# Patient Record
Sex: Male | Born: 1960 | Race: Black or African American | Hispanic: No | Marital: Married | Smoking: Former smoker
Health system: Southern US, Community
[De-identification: ages and names within clinical notes are randomized; demographics above are authoritative.]

## PROBLEM LIST (undated history)

## (undated) DIAGNOSIS — I10 Essential (primary) hypertension: Secondary | ICD-10-CM

## (undated) DIAGNOSIS — Z9189 Other specified personal risk factors, not elsewhere classified: Secondary | ICD-10-CM

## (undated) DIAGNOSIS — Z973 Presence of spectacles and contact lenses: Secondary | ICD-10-CM

## (undated) DIAGNOSIS — L089 Local infection of the skin and subcutaneous tissue, unspecified: Secondary | ICD-10-CM

## (undated) HISTORY — PX: SHOULDER ARTHROSCOPY: SHX128

---

## 1994-02-26 HISTORY — PX: ELBOW SURGERY: SHX618

## 1999-06-15 ENCOUNTER — Encounter: Payer: Self-pay | Admitting: Specialist

## 1999-06-15 ENCOUNTER — Ambulatory Visit (HOSPITAL_COMMUNITY): Admission: RE | Admit: 1999-06-15 | Discharge: 1999-06-15 | Payer: Self-pay | Admitting: Specialist

## 2000-04-29 ENCOUNTER — Encounter: Payer: Self-pay | Admitting: Specialist

## 2000-04-29 ENCOUNTER — Ambulatory Visit (HOSPITAL_COMMUNITY): Admission: RE | Admit: 2000-04-29 | Discharge: 2000-04-29 | Payer: Self-pay | Admitting: Specialist

## 2000-06-26 ENCOUNTER — Encounter: Admission: RE | Admit: 2000-06-26 | Discharge: 2000-06-26 | Payer: Self-pay | Admitting: Occupational Medicine

## 2000-06-26 ENCOUNTER — Encounter: Payer: Self-pay | Admitting: Occupational Medicine

## 2002-05-01 ENCOUNTER — Encounter: Payer: Self-pay | Admitting: Specialist

## 2002-05-01 ENCOUNTER — Ambulatory Visit (HOSPITAL_COMMUNITY): Admission: RE | Admit: 2002-05-01 | Discharge: 2002-05-01 | Payer: Self-pay | Admitting: Emergency Medicine

## 2002-11-04 HISTORY — PX: CALCANEAL OSTEOTOMY WITH ILIAC CREST BONE GRAFT AND REPAIR SUBLEXING TENDON AND ACHILLES TENDON: SHX6325

## 2002-11-24 ENCOUNTER — Ambulatory Visit (HOSPITAL_COMMUNITY): Admission: RE | Admit: 2002-11-24 | Discharge: 2002-11-24 | Payer: Self-pay | Admitting: Orthopedic Surgery

## 2002-11-24 ENCOUNTER — Ambulatory Visit (HOSPITAL_BASED_OUTPATIENT_CLINIC_OR_DEPARTMENT_OTHER): Admission: RE | Admit: 2002-11-24 | Discharge: 2002-11-24 | Payer: Self-pay | Admitting: Orthopedic Surgery

## 2003-09-23 ENCOUNTER — Ambulatory Visit (HOSPITAL_COMMUNITY): Admission: RE | Admit: 2003-09-23 | Discharge: 2003-09-23 | Payer: Self-pay | Admitting: Orthopaedic Surgery

## 2005-04-09 ENCOUNTER — Ambulatory Visit: Payer: Self-pay | Admitting: Internal Medicine

## 2005-04-12 ENCOUNTER — Ambulatory Visit: Payer: Self-pay | Admitting: Internal Medicine

## 2005-04-18 ENCOUNTER — Ambulatory Visit: Payer: Self-pay | Admitting: Internal Medicine

## 2005-05-02 ENCOUNTER — Ambulatory Visit: Payer: Self-pay | Admitting: Internal Medicine

## 2005-07-10 ENCOUNTER — Ambulatory Visit: Payer: Self-pay | Admitting: Internal Medicine

## 2005-09-05 ENCOUNTER — Ambulatory Visit: Payer: Self-pay | Admitting: Internal Medicine

## 2007-02-05 ENCOUNTER — Ambulatory Visit: Payer: Self-pay | Admitting: Internal Medicine

## 2007-02-10 ENCOUNTER — Ambulatory Visit: Payer: Self-pay | Admitting: Internal Medicine

## 2007-02-18 DIAGNOSIS — I10 Essential (primary) hypertension: Secondary | ICD-10-CM | POA: Insufficient documentation

## 2007-02-18 DIAGNOSIS — K279 Peptic ulcer, site unspecified, unspecified as acute or chronic, without hemorrhage or perforation: Secondary | ICD-10-CM | POA: Insufficient documentation

## 2007-08-07 ENCOUNTER — Emergency Department (HOSPITAL_COMMUNITY): Admission: EM | Admit: 2007-08-07 | Discharge: 2007-08-07 | Payer: Self-pay | Admitting: Family Medicine

## 2007-08-08 ENCOUNTER — Telehealth (INDEPENDENT_AMBULATORY_CARE_PROVIDER_SITE_OTHER): Payer: Self-pay | Admitting: *Deleted

## 2007-08-08 ENCOUNTER — Encounter (INDEPENDENT_AMBULATORY_CARE_PROVIDER_SITE_OTHER): Payer: Self-pay | Admitting: *Deleted

## 2007-08-08 DIAGNOSIS — K921 Melena: Secondary | ICD-10-CM | POA: Insufficient documentation

## 2008-03-06 ENCOUNTER — Ambulatory Visit (HOSPITAL_COMMUNITY): Admission: RE | Admit: 2008-03-06 | Discharge: 2008-03-06 | Payer: Self-pay | Admitting: Otolaryngology

## 2008-03-22 ENCOUNTER — Ambulatory Visit: Payer: Self-pay | Admitting: Internal Medicine

## 2008-03-22 LAB — CONVERTED CEMR LAB
ALT: 20 units/L (ref 0–53)
AST: 23 units/L (ref 0–37)
Albumin: 3.8 g/dL (ref 3.5–5.2)
Alkaline Phosphatase: 66 units/L (ref 39–117)
BUN: 14 mg/dL (ref 6–23)
Basophils Absolute: 0.1 10*3/uL (ref 0.0–0.1)
Basophils Relative: 1.2 % (ref 0.0–3.0)
Bilirubin Urine: NEGATIVE
Bilirubin, Direct: 0.1 mg/dL (ref 0.0–0.3)
CO2: 27 meq/L (ref 19–32)
Calcium: 8.7 mg/dL (ref 8.4–10.5)
Chloride: 111 meq/L (ref 96–112)
Cholesterol: 120 mg/dL (ref 0–200)
Creatinine, Ser: 0.9 mg/dL (ref 0.4–1.5)
Eosinophils Absolute: 0.1 10*3/uL (ref 0.0–0.7)
Eosinophils Relative: 2 % (ref 0.0–5.0)
GFR calc Af Amer: 116 mL/min
GFR calc non Af Amer: 96 mL/min
Glucose, Bld: 99 mg/dL (ref 70–99)
HCT: 38 % — ABNORMAL LOW (ref 39.0–52.0)
HDL: 59.7 mg/dL (ref >39.0)
Hemoglobin, Urine: NEGATIVE
Hemoglobin: 13 g/dL (ref 13.0–17.0)
Ketones, ur: NEGATIVE mg/dL
LDL Cholesterol: 51 mg/dL (ref 0–99)
Leukocytes, UA: NEGATIVE
Lymphocytes Relative: 36.8 % (ref 12.0–46.0)
MCHC: 34.2 g/dL (ref 30.0–36.0)
MCV: 86.3 fL (ref 78.0–100.0)
Monocytes Absolute: 0.4 10*3/uL (ref 0.1–1.0)
Monocytes Relative: 7.5 % (ref 3.0–12.0)
Neutro Abs: 2.6 10*3/uL (ref 1.4–7.7)
Neutrophils Relative %: 52.5 % (ref 43.0–77.0)
Nitrite: NEGATIVE
PSA: 0.7 ng/mL (ref 0.10–4.00)
Platelets: 182 10*3/uL (ref 150–400)
Potassium: 4.1 meq/L (ref 3.5–5.1)
RBC: 4.41 M/uL (ref 4.22–5.81)
RDW: 13.7 % (ref 11.5–14.6)
Sodium: 144 meq/L (ref 135–145)
Specific Gravity, Urine: >=1.03 (ref 1.000–1.03)
TSH: 1.48 microintl units/mL (ref 0.35–5.50)
Total Bilirubin: 0.7 mg/dL (ref 0.3–1.2)
Total CHOL/HDL Ratio: 2
Total Protein, Urine: NEGATIVE mg/dL
Total Protein: 6.8 g/dL (ref 6.0–8.3)
Triglycerides: 46 mg/dL (ref 0–149)
Urine Glucose: NEGATIVE mg/dL
Urobilinogen, UA: 0.2 (ref 0.0–1.0)
VLDL: 9 mg/dL (ref 0–40)
WBC: 5.1 10*3/uL (ref 4.5–10.5)
pH: 5.5 (ref 5.0–8.0)

## 2008-03-26 ENCOUNTER — Ambulatory Visit: Payer: Self-pay | Admitting: Internal Medicine

## 2008-04-23 ENCOUNTER — Telehealth (INDEPENDENT_AMBULATORY_CARE_PROVIDER_SITE_OTHER): Payer: Self-pay | Admitting: *Deleted

## 2009-08-25 ENCOUNTER — Encounter: Payer: Self-pay | Admitting: Internal Medicine

## 2009-09-23 ENCOUNTER — Encounter: Payer: Self-pay | Admitting: Internal Medicine

## 2009-11-29 ENCOUNTER — Encounter: Payer: Self-pay | Admitting: Internal Medicine

## 2010-03-28 NOTE — Letter (Signed)
Summary: [facility]  [facility]   Imported By: Lester [place] 09/08/2009 07:29:07  _____________________________________________________________________  External Attachment:    Type:   Image     Comment:   External Document

## 2010-03-28 NOTE — Letter (Signed)
Summary: [facility]  [facility]   Imported By: Sherian Rein 12/05/2009 11:17:02  _____________________________________________________________________  External Attachment:    Type:   Image     Comment:   External Document

## 2010-03-28 NOTE — Letter (Signed)
Summary: [facility]  [facility]   Imported By: Sherian Rein 09/26/2009 14:56:25  _____________________________________________________________________  External Attachment:    Type:   Image     Comment:   External Document

## 2010-07-14 NOTE — Op Note (Signed)
Peter Hunt, Peter Hunt                             ACCOUNT NO.:  [id number]   MEDICAL RECORD NO.:  [id number]                   PATIENT TYPE:  AMB   LOCATION:  DSC                                  FACILITY:  [facility]   PHYSICIAN:  Leonides Grills, M.D.                  DATE OF BIRTH:  1960/07/21   DATE OF PROCEDURE:  11/24/2002  DATE OF DISCHARGE:                                 OPERATIVE REPORT   PREOPERATIVE DIAGNOSES:  1. 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  2. Right hindfoot malposition/right tight Achilles' tendon.   POSTOPERATIVE DIAGNOSES:  1. 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  2. Right hindfoot malposition/right tight Achilles' tendon.   OPERATION:  1. Right lengthening calcaneal osteotomy.  2. Right iliac crest bone graft.  3. Right medializing calcaneal osteotomy.  4. Right percutaneous tendo-Achilles lengthening.  5. Right flexor-digitorum-longus-to-navicular transfer.  6. Right tenosynovectomy, posterior tibial tendon.   SURGEON:  Leonides Grills, M.D.   ASSISTANT:  Lianne Cure, P.A.   ANESTHESIA:  General endotracheal tube.   ESTIMATED BLOOD LOSS:  Minimal.   TOURNIQUET TIME:  Approximately an hour and a half.   COMPLICATIONS:  None.   DISPOSITION:  Stable to PR.   INDICATION:  This is a 50 year old male who has had prolonged posterior  ankle pain as well as arch pain, despite conservative management.  He was  consented for the above procedure.  All risks which include infection,  neurovascular injury, nonunion, malunion, hardware rotation, hardware  failure, persistent pain, worsening pain, stiffness, arthritis, recurrence  of deformity and pain were all explained, questions were encouraged and  answered.   OPERATION:  The patient was brought to the operating room and placed in  supine position after adequate general endotracheal tube anesthesia was  administered as well as Ancef 1 g IV piggyback.  The  right lower extremity  was then prepped and draped in a sterile manner over a proximally placed  thigh tourniquet as well as the right iliac crest bone graft site.  The  procedure commenced with a longitudinal incision over the iliac crest bone  graft site.  Dissection was carried down to the crest; hemostasis was  obtained.  Soft tissues were elevated over the inner and outer table of the  right iliac crest.  Then with a sagittal saw, an approximately 8-mm bone  graft trapezoidal-shaped tricortical was then harvested with the aid of a  sagittal saw and a curved quarter-inch osteotome.  Once this was removed,  bone wax was placed to exposed bone areas.  Deep tissues were closed with 0  Vicryl, subcu was closed with 2-0 Vicryl and skin was closed with 4-0  Monocryl subcuticular stitches and Steri-Strips were applied.  We placed the  bone graft on the back table for later lengthening calcaneal osteotomy.  We  then performed two medial and one lateral hemisections of  the Achilles'  tendon; this had excellent release of the tight Achilles' tendon.  We then  gravity-exsanguinated the right lower extremity and tourniquet was elevated  to 290 mmHg.  A longitudinal incision over the posterior tibial tendon was  then made.  Hemostasis was obtained.  Flexor retinaculum was opened wide at  the incision; posterior tibial tendon was then identified.  There was a  large amount of tenosynovitis in this area; this was meticulously debrided.  There were no rents in the tendon and there was excursion with the tendon,  so we decided to leave the tendon and not excise it.  We then identified the  FDL tendon and traced this to knot of Henry and tenotomized this and left  this in the wound for later transfer to the navicular.  We then went on the  lateral aspect of the foot.  A longitudinal incision was then made over the  calcaneal neck.  Dissection was carried down through skin and hemostasis was  obtained.   Soft tissue was then elevated over the superior and inferior  aspect of the calcaneal neck.  CC joint was then identified and  approximately 1.2 cm proximal to this, an osteotomy was then made  perpendicular to the lateral wall of the calcaneus, parallel to the CC  joint.  Once this was made, laminar spreader was then placed and the  lengthening calcaneal osteotomy was then done.  The tricortical iliac crest  bone graft was then tamped into place.  This was then affixed with a 3.5-mm  fully threaded cortical screw using a 2.5-mm drill hole, respectively; this  had excellent maintenance of the lengthening.  Peroneal tendons were intact  as well.  Wound was copiously irrigated with normal saline.  We then made a  longitudinal incision over the lateral wall of the calcaneus, over the  calcaneal tuber.  Dissection was carried down to bone.  Soft tissues were  elevated both superiorly and inferiorly, then utilizing a 4000 saw, an  osteotomy was then made with Homan retractors placed both superiorly and  inferiorly.  Once this was done, a large osteotome followed by a large  laminar spreader were then placed to stretch the soft tissues and the heel  was then translated medially approximately 8 to 10 mm.  This was then  provisionally affixed with a 2-mm K-wire.  A longitudinal incision was then  made in the midline of the heel.  Dissection was carried down to bone.  Two  55-mm 16-mm threaded cancellous screws were then placed using 4.5- and 3.2-  mm drill holes, respectively.  K-wires were then removed.  X-rays were then  obtained in the AP axial and lateral views of the hindfoot and showed  excellent placement of the fixation as well as osteotomies with correction  of the hindfoot.  The wounds were copiously irrigated with normal saline.  We then went back onto the medial aspect of the foot.  The 3.5-mm and 4.5-mm drill holes were then made in the medial aspect of the navicular and through  the  middle of the posterior tibial tendon plantarly.  The FDL tendon was  then pulled through the posterior tibial tendon and in the drill hole using  a #2 FiberWire.  This was then sewn and transferred to the navicular as well  as the posterior tibial tendon using #2 FiberWire and 2-0 FiberWire.  Wound  was copiously irrigated with normal saline, tourniquet was deflated,  hemostasis was obtained.  Flexor retinaculum  was closed with 2-0 Vicryl  sutures, subcu was closed with 3-0 Vicryl sutures and skin was closed with 4-  0 nylon sutures over all wounds.  Sterile dressing was applied.  Modified  Jones dressing was applied with the ankle in neutral dorsiflexion.  The  patient was stable to the PR.                                               Leonides Grills, M.D.    PB/MEDQ  D:  11/24/2002  T:  11/24/2002  Job:  [id number]

## 2010-11-23 LAB — POCT I-STAT, CHEM 8
BUN: 15
Calcium, Ion: 1.24
Chloride: 104
Creatinine, Ser: 1.2
Sodium: 143
TCO2: 30

## 2014-02-03 ENCOUNTER — Emergency Department (HOSPITAL_COMMUNITY): Payer: Worker's Compensation

## 2014-02-03 ENCOUNTER — Emergency Department (HOSPITAL_COMMUNITY): Payer: Worker's Compensation | Admitting: Anesthesiology

## 2014-02-03 ENCOUNTER — Encounter (HOSPITAL_COMMUNITY)
Admission: EM | Disposition: A | Discharge status: Home / Self Care | Payer: Self-pay | Source: Home / Self Care | Attending: Emergency Medicine

## 2014-02-03 ENCOUNTER — Ambulatory Visit (HOSPITAL_COMMUNITY)
Admission: EM | Admit: 2014-02-03 | Discharge: 2014-02-03 | Disposition: A | Discharge status: Home / Self Care | Payer: Worker's Compensation | Attending: Emergency Medicine | Admitting: Emergency Medicine

## 2014-02-03 ENCOUNTER — Encounter (HOSPITAL_COMMUNITY): Payer: Self-pay | Admitting: Emergency Medicine

## 2014-02-03 DIAGNOSIS — S63251A Unspecified dislocation of left index finger, initial encounter: Secondary | ICD-10-CM | POA: Insufficient documentation

## 2014-02-03 DIAGNOSIS — Y929 Unspecified place or not applicable: Secondary | ICD-10-CM | POA: Diagnosis not present

## 2014-02-03 DIAGNOSIS — IMO0002 Reserved for concepts with insufficient information to code with codable children: Secondary | ICD-10-CM

## 2014-02-03 DIAGNOSIS — W208XXA Other cause of strike by thrown, projected or falling object, initial encounter: Secondary | ICD-10-CM | POA: Insufficient documentation

## 2014-02-03 DIAGNOSIS — IMO0001 Reserved for inherently not codable concepts without codable children: Secondary | ICD-10-CM

## 2014-02-03 DIAGNOSIS — Z008 Encounter for other general examination: Secondary | ICD-10-CM

## 2014-02-03 DIAGNOSIS — Z01818 Encounter for other preprocedural examination: Secondary | ICD-10-CM

## 2014-02-03 DIAGNOSIS — I10 Essential (primary) hypertension: Secondary | ICD-10-CM | POA: Insufficient documentation

## 2014-02-03 DIAGNOSIS — T148 Other injury of unspecified body region: Secondary | ICD-10-CM | POA: Diagnosis present

## 2014-02-03 HISTORY — PX: I & D EXTREMITY: SHX5045

## 2014-02-03 HISTORY — PX: I&D EXTREMITY: SHX5045

## 2014-02-03 HISTORY — PX: PERCUTANEOUS PINNING: SHX2209

## 2014-02-03 HISTORY — DX: Essential (primary) hypertension: I10

## 2014-02-03 LAB — CBC
HCT: 41 % (ref 39.0–52.0)
HEMOGLOBIN: 13.9 g/dL (ref 13.0–17.0)
MCH: 28.5 pg (ref 26.0–34.0)
MCHC: 33.9 g/dL (ref 30.0–36.0)
MCV: 84 fL (ref 78.0–100.0)
Platelets: 199 10*3/uL (ref 150–400)
RBC: 4.88 MIL/uL (ref 4.22–5.81)
RDW: 13.9 % (ref 11.5–15.5)
WBC: 6.7 10*3/uL (ref 4.0–10.5)

## 2014-02-03 LAB — BASIC METABOLIC PANEL
Anion gap: 15 (ref 5–15)
BUN: 13 mg/dL (ref 6–23)
CHLORIDE: 104 meq/L (ref 96–112)
CO2: 23 mEq/L (ref 19–32)
Calcium: 9.5 mg/dL (ref 8.4–10.5)
Creatinine, Ser: 0.84 mg/dL (ref 0.50–1.35)
GLUCOSE: 96 mg/dL (ref 70–99)
POTASSIUM: 4.3 meq/L (ref 3.7–5.3)
SODIUM: 142 meq/L (ref 137–147)

## 2014-02-03 SURGERY — IRRIGATION AND DEBRIDEMENT EXTREMITY
Anesthesia: General | Site: Finger | Laterality: Left

## 2014-02-03 MED ORDER — MEPERIDINE HCL 25 MG/ML IJ SOLN: 6.2500 mg | INTRAMUSCULAR | Status: DC | PRN

## 2014-02-03 MED ORDER — ONDANSETRON HCL 4 MG/2ML IJ SOLN
INTRAMUSCULAR | Status: DC | PRN
Start: 2014-02-03 — End: 2014-02-03
  Administered 2014-02-03: 4 mg via INTRAVENOUS

## 2014-02-03 MED ORDER — MIDAZOLAM HCL 2 MG/2ML IJ SOLN: 0.5000 mg | Freq: Once | INTRAMUSCULAR | Status: DC | PRN

## 2014-02-03 MED ORDER — DOCUSATE SODIUM 100 MG PO CAPS: 100.0000 mg | ORAL_CAPSULE | Freq: Two times a day (BID) | ORAL | Status: DC

## 2014-02-03 MED ORDER — MIDAZOLAM HCL 5 MG/5ML IJ SOLN
INTRAMUSCULAR | Status: DC | PRN
  Administered 2014-02-03: 2 mg via INTRAVENOUS

## 2014-02-03 MED ORDER — FENTANYL CITRATE 0.05 MG/ML IJ SOLN
INTRAMUSCULAR | Status: DC | PRN
  Administered 2014-02-03 (×2): 50 ug via INTRAVENOUS

## 2014-02-03 MED ORDER — OXYCODONE HCL 5 MG PO TABS: 5.0000 mg | ORAL_TABLET | Freq: Once | ORAL | Status: DC | PRN

## 2014-02-03 MED ORDER — FENTANYL CITRATE 0.05 MG/ML IJ SOLN
INTRAMUSCULAR | Status: AC
  Filled 2014-02-03: qty 5

## 2014-02-03 MED ORDER — HYDROMORPHONE HCL 1 MG/ML IJ SOLN
INTRAMUSCULAR | Status: AC
  Filled 2014-02-03: qty 1

## 2014-02-03 MED ORDER — LACTATED RINGERS IV SOLN
INTRAVENOUS | Status: DC | PRN
  Administered 2014-02-03: 21:00:00 via INTRAVENOUS

## 2014-02-03 MED ORDER — PROPOFOL 10 MG/ML IV BOLUS
INTRAVENOUS | Status: DC | PRN
  Administered 2014-02-03: 200 mg via INTRAVENOUS
  Administered 2014-02-03: 100 mg via INTRAVENOUS

## 2014-02-03 MED ORDER — PROMETHAZINE HCL 25 MG/ML IJ SOLN: 6.2500 mg | INTRAMUSCULAR | Status: DC | PRN

## 2014-02-03 MED ORDER — CEPHALEXIN 500 MG PO CAPS: 500.0000 mg | ORAL_CAPSULE | Freq: Four times a day (QID) | ORAL | Status: DC

## 2014-02-03 MED ORDER — MIDAZOLAM HCL 2 MG/2ML IJ SOLN
INTRAMUSCULAR | Status: AC
  Filled 2014-02-03: qty 2

## 2014-02-03 MED ORDER — TETANUS-DIPHTH-ACELL PERTUSSIS 5-2.5-18.5 LF-MCG/0.5 IM SUSP
0.5000 mL | Freq: Once | INTRAMUSCULAR | Status: AC
  Administered 2014-02-03: 0.5 mL via INTRAMUSCULAR
  Filled 2014-02-03: qty 0.5

## 2014-02-03 MED ORDER — BUPIVACAINE HCL (PF) 0.25 % IJ SOLN
INTRAMUSCULAR | Status: AC
  Filled 2014-02-03: qty 30

## 2014-02-03 MED ORDER — BUPIVACAINE HCL (PF) 0.25 % IJ SOLN
INTRAMUSCULAR | Status: DC | PRN
  Administered 2014-02-03: 6 mL

## 2014-02-03 MED ORDER — CEFAZOLIN SODIUM-DEXTROSE 2-3 GM-% IV SOLR
INTRAVENOUS | Status: DC | PRN
  Administered 2014-02-03: 2 g via INTRAVENOUS

## 2014-02-03 MED ORDER — OXYCODONE HCL 5 MG/5ML PO SOLN: 5.0000 mg | Freq: Once | ORAL | Status: DC | PRN

## 2014-02-03 MED ORDER — HYDROMORPHONE HCL 1 MG/ML IJ SOLN
0.2500 mg | INTRAMUSCULAR | Status: DC | PRN
  Administered 2014-02-03: 0.25 mg via INTRAVENOUS

## 2014-02-03 MED ORDER — HYDROCODONE-ACETAMINOPHEN 5-300 MG PO TABS
1.0000 | ORAL_TABLET | Freq: Four times a day (QID) | ORAL | Status: DC | PRN
Start: 2014-02-03 — End: 2014-03-04

## 2014-02-03 MED ORDER — OXYCODONE-ACETAMINOPHEN 5-325 MG PO TABS
2.0000 | ORAL_TABLET | Freq: Once | ORAL | Status: AC
  Administered 2014-02-03: 2 via ORAL
  Filled 2014-02-03: qty 2

## 2014-02-03 SURGICAL SUPPLY — 66 items
ANCHOR FT CORKSCREW MICRO 2-0 (Anchor) ×4 IMPLANT
AR MINI SUTURE KIT ×2 IMPLANT
BANDAGE ELASTIC 3 VELCRO ST LF (GAUZE/BANDAGES/DRESSINGS) ×2 IMPLANT
BANDAGE ELASTIC 4 VELCRO ST LF (GAUZE/BANDAGES/DRESSINGS) ×2 IMPLANT
BENZOIN TINCTURE PRP APPL 2/3 (GAUZE/BANDAGES/DRESSINGS) IMPLANT
BLADE SURG ROTATE 9660 (MISCELLANEOUS) IMPLANT
BNDG COHESIVE 1X5 TAN STRL LF (GAUZE/BANDAGES/DRESSINGS) ×2 IMPLANT
BNDG CONFORM 2 STRL LF (GAUZE/BANDAGES/DRESSINGS) IMPLANT
BNDG ESMARK 4X9 LF (GAUZE/BANDAGES/DRESSINGS) ×2 IMPLANT
BNDG GAUZE ELAST 4 BULKY (GAUZE/BANDAGES/DRESSINGS) ×2 IMPLANT
CORDS BIPOLAR (ELECTRODE) ×2 IMPLANT
COVER SURGICAL LIGHT HANDLE (MISCELLANEOUS) ×2 IMPLANT
CUFF TOURNIQUET SINGLE 18IN (TOURNIQUET CUFF) ×2 IMPLANT
CUFF TOURNIQUET SINGLE 24IN (TOURNIQUET CUFF) IMPLANT
DRAIN PENROSE 1/4X12 LTX STRL (WOUND CARE) IMPLANT
DRAPE OEC MINIVIEW 54X84 (DRAPES) ×2 IMPLANT
DRAPE SURG 17X23 STRL (DRAPES) ×2 IMPLANT
DRSG ADAPTIC 3X8 NADH LF (GAUZE/BANDAGES/DRESSINGS) ×2 IMPLANT
DRSG EMULSION OIL 3X3 NADH (GAUZE/BANDAGES/DRESSINGS) IMPLANT
ELECT REM PT RETURN 9FT ADLT (ELECTROSURGICAL)
ELECTRODE REM PT RTRN 9FT ADLT (ELECTROSURGICAL) IMPLANT
GAUZE SPONGE 4X4 12PLY STRL (GAUZE/BANDAGES/DRESSINGS) ×2 IMPLANT
GAUZE XEROFORM 1X8 LF (GAUZE/BANDAGES/DRESSINGS) ×2 IMPLANT
GAUZE XEROFORM 5X9 LF (GAUZE/BANDAGES/DRESSINGS) IMPLANT
GLOVE BIOGEL PI IND STRL 8.5 (GLOVE) ×1 IMPLANT
GLOVE BIOGEL PI INDICATOR 8.5 (GLOVE) ×1
GLOVE SURG ORTHO 8.0 STRL STRW (GLOVE) ×2 IMPLANT
GOWN STRL REUS W/ TWL LRG LVL3 (GOWN DISPOSABLE) ×3 IMPLANT
GOWN STRL REUS W/ TWL XL LVL3 (GOWN DISPOSABLE) ×1 IMPLANT
GOWN STRL REUS W/TWL LRG LVL3 (GOWN DISPOSABLE) ×3
GOWN STRL REUS W/TWL XL LVL3 (GOWN DISPOSABLE) ×1
HANDPIECE INTERPULSE COAX TIP (DISPOSABLE)
K-WIRE DBL TROCAR .045X4 ×2 IMPLANT
KIT BASIN OR (CUSTOM PROCEDURE TRAY) ×2 IMPLANT
KIT ROOM TURNOVER OR (KITS) ×2 IMPLANT
KWIRE DBL TROCAR .045X4 ×1 IMPLANT
MANIFOLD NEPTUNE II (INSTRUMENTS) ×2 IMPLANT
NEEDLE HYPO 25GX1X1/2 BEV (NEEDLE) ×2 IMPLANT
NS IRRIG 1000ML POUR BTL (IV SOLUTION) ×2 IMPLANT
PACK ORTHO EXTREMITY (CUSTOM PROCEDURE TRAY) ×2 IMPLANT
PAD ARMBOARD 7.5X6 YLW CONV (MISCELLANEOUS) ×4 IMPLANT
PAD CAST 4YDX4 CTTN HI CHSV (CAST SUPPLIES) ×1 IMPLANT
PADDING CAST COTTON 4X4 STRL (CAST SUPPLIES) ×1
SET HNDPC FAN SPRY TIP SCT (DISPOSABLE) IMPLANT
SOAP 2 % CHG 4 OZ (WOUND CARE) IMPLANT
SPLINT FINGER (SOFTGOODS) ×2 IMPLANT
SPONGE GAUZE 4X4 12PLY STER LF (GAUZE/BANDAGES/DRESSINGS) ×2 IMPLANT
SPONGE LAP 18X18 X RAY DECT (DISPOSABLE) ×2 IMPLANT
SPONGE LAP 4X18 X RAY DECT (DISPOSABLE) ×2 IMPLANT
STRIP CLOSURE SKIN 1/2X4 (GAUZE/BANDAGES/DRESSINGS) IMPLANT
SUCTION FRAZIER TIP 10 FR DISP (SUCTIONS) ×2 IMPLANT
SUT CHROMIC 5 0 P 3 (SUTURE) ×2 IMPLANT
SUT ETHILON 4 0 P 3 18 (SUTURE) IMPLANT
SUT ETHILON 4 0 PS 2 18 (SUTURE) IMPLANT
SUT ETHILON 5 0 P 3 18 (SUTURE) ×1
SUT NYLON ETHILON 5-0 P-3 1X18 (SUTURE) ×1 IMPLANT
SUT PROLENE 4 0 P 3 18 (SUTURE) IMPLANT
SUT VICRYL RAPIDE 4/0 PS 2 (SUTURE) ×2 IMPLANT
SYR CONTROL 10ML LL (SYRINGE) ×2 IMPLANT
TOWEL OR 17X24 6PK STRL BLUE (TOWEL DISPOSABLE) ×2 IMPLANT
TOWEL OR 17X26 10 PK STRL BLUE (TOWEL DISPOSABLE) ×2 IMPLANT
TUBE ANAEROBIC SPECIMEN COL (MISCELLANEOUS) IMPLANT
TUBE CONNECTING 12X1/4 (SUCTIONS) ×2 IMPLANT
UNDERPAD 30X30 INCONTINENT (UNDERPADS AND DIAPERS) ×2 IMPLANT
WATER STERILE IRR 1000ML POUR (IV SOLUTION) ×2 IMPLANT
YANKAUER SUCT BULB TIP NO VENT (SUCTIONS) ×2 IMPLANT

## 2014-02-03 NOTE — Anesthesia Postprocedure Evaluation (Signed)
  Anesthesia Post-op Note  Patient: Herb Grays  Procedure(s) Performed: Procedure(s): IRRIGATION AND DEBRIDEMENT LEFT INDEX FINGER,  REPAIR TENDON LEFT INDEX FINGER (Left) PERCUTANEOUS PINNING LEFT INDEX FINGER (Left)  Patient Location: PACU  Anesthesia Type:General  Level of Consciousness: awake, alert , oriented and patient cooperative  Airway and Oxygen Therapy: Patient Spontanous Breathing  Post-op Pain: none  Post-op Assessment: Post-op Vital signs reviewed, Patient's Cardiovascular Status Stable, Respiratory Function Stable, Patent Airway, No signs of Nausea or vomiting, Adequate PO intake and Pain level controlled  Post-op Vital Signs: Reviewed and stable  Last Vitals:  Filed Vitals:   02/03/14 2300  BP: 157/98  Pulse: 93  Temp:   Resp: 15    Complications: No apparent anesthesia complications

## 2014-02-03 NOTE — Discharge Instructions (Signed)
KEEP BANDAGE CLEAN AND DRY CALL OFFICE FOR F/U APPT [phone] IN 8 DAYS DR Elkader PHONE [phone] ALSO CALL FOR THERAPY APPT IN 8 DAYS [phone] EXT [phone] IN 8 DAYS KEEP HAND ELEVATED ABOVE HEART OK TO APPLY ICE TO OPERATIVE AREA CONTACT OFFICE IF ANY WORSENING PAIN OR CONCERNS.

## 2014-02-03 NOTE — Brief Op Note (Signed)
02/03/2014  8:26 PM  PATIENT:  Peter Hunt  53 y.o. male  PRE-OPERATIVE DIAGNOSIS:  Fractured Left Index Finger  POST-OPERATIVE DIAGNOSIS:  * No post-op diagnosis entered *  PROCEDURE:  Procedure(s): IRRIGATION AND DEBRIDEMENT EXTREMITY (Left) PERCUTANEOUS PINNING EXTREMITY (Left)  SURGEON:  Surgeon(s) and Role:    * Linna Hoff, MD - Primary  PHYSICIAN ASSISTANT:   ASSISTANTS: none   ANESTHESIA:   general  EBL:     BLOOD ADMINISTERED:none  DRAINS: none   LOCAL MEDICATIONS USED:  NONE  SPECIMEN:  No Specimen  DISPOSITION OF SPECIMEN:  N/A  COUNTS:  YES  TOURNIQUET:    DICTATION: .[id number]  PLAN OF CARE: Discharge to home after PACU  PATIENT DISPOSITION:  PACU - hemodynamically stable.   Delay start of Pharmacological VTE agent (>24hrs) due to surgical blood loss or risk of bleeding: not applicable

## 2014-02-03 NOTE — Transfer of Care (Signed)
Immediate Anesthesia Transfer of Care Note  Patient: Peter Hunt  Procedure(s) Performed: Procedure(s): IRRIGATION AND DEBRIDEMENT LEFT INDEX FINGER,  REPAIR TENDON LEFT INDEX FINGER (Left) PERCUTANEOUS PINNING LEFT INDEX FINGER (Left)  Patient Location: PACU  Anesthesia Type:General  Level of Consciousness: awake, alert  and oriented  Airway & Oxygen Therapy: Patient Spontanous Breathing and Patient connected to nasal cannula oxygen  Post-op Assessment: Report given to PACU RN and Post -op Vital signs reviewed and stable  Post vital signs: Reviewed and stable  Complications: No apparent anesthesia complications

## 2014-02-03 NOTE — ED Notes (Signed)
LAST FOOD/FLUID AT 1000 TODAY.

## 2014-02-03 NOTE — Anesthesia Preprocedure Evaluation (Addendum)
Anesthesia Evaluation    Airway        Dental   Pulmonary          Cardiovascular hypertension, Pt. on medications     Neuro/Psych    GI/Hepatic   Endo/Other    Renal/GU      Musculoskeletal   Abdominal   Peds  Hematology   Anesthesia Other Findings   Reproductive/Obstetrics                            Anesthesia Physical Anesthesia Plan  ASA: II  Anesthesia Plan:    Post-op Pain Management:    Induction:   Airway Management Planned:   Additional Equipment:   Intra-op Plan:   Post-operative Plan:   Informed Consent:   Plan Discussed with:   Anesthesia Plan Comments:         Anesthesia Quick Evaluation

## 2014-02-03 NOTE — ED Provider Notes (Signed)
CSN: [id number]     Arrival date & time 02/03/14  1323 History  This chart was scribed for non-physician practitioner, Montine Circle, PA-C working with Fredia Sorrow, MD by Frederich Balding, ED scribe. This patient was seen in room TR11C/TR11C and the patient's care was started at 1:41 PM.   Chief Complaint  Patient presents with  . Finger Injury   The history is provided by the patient. No language interpreter was used.    HPI Comments: Peter Hunt is a 53 y.o. male who presents to the Emergency Department complaining of left index finger injury that occurred earlier today around 10 AM. States he dropped a flat steel bar on his finger. Reports sudden onset pain and laceration. Pt went to [facility] after the accident happened and was told to come to the ED for evaluation. He has not been given anything for the pain yet. Pt states is tetanus is out of date.   Past Medical History  Diagnosis Date  . Hypertension    Past Surgical History  Procedure Laterality Date  . Shoulder surgery    . Elbow surgery    . Knee surgery     No family history on file. History  Substance Use Topics  . Smoking status: Never Smoker   . Smokeless tobacco: Not on file  . Alcohol Use: No    Review of Systems  Constitutional: Negative for fever.  HENT: Negative for congestion.   Eyes: Negative for redness.  Respiratory: Negative for shortness of breath.   Cardiovascular: Negative for chest pain.  Gastrointestinal: Negative for abdominal distention.  Musculoskeletal: Positive for arthralgias.  Skin: Positive for wound.  Neurological: Negative for speech difficulty.  Psychiatric/Behavioral: Negative for confusion.   Allergies  Ace inhibitors  Home Medications   Prior to Admission medications   Not on File   BP 157/89 mmHg  Pulse 93  Temp(Src) 98.8 F (37.1 C) (Oral)  Resp 18  Ht 5\' 8"  (1.727 m)  Wt 185 lb (83.915 kg)  BMI 28.14 kg/m2  SpO2 96%   Physical Exam  Constitutional: He is  oriented to person, place, and time. He appears well-developed. No distress.  HENT:  Head: Normocephalic and atraumatic.  Eyes: Conjunctivae and EOM are normal.  Cardiovascular: Normal rate, regular rhythm and intact distal pulses.   Brisk capillary refill.  Pulmonary/Chest: Effort normal. No stridor. No respiratory distress.  Abdominal: He exhibits no distension.  Musculoskeletal: He exhibits no edema.  Left index finger flexion 5/5 at the DIP and PIP. Extension 5/5 at the PIP and 0/5 at the DIP.   Neurological: He is alert and oriented to person, place, and time.  Sensation intact.  Skin: Skin is warm and dry.  2-3 cm semi-circular laceration on the left index finger as pictured. Apparent open fracture.  Psychiatric: He has a normal mood and affect.  Nursing note and vitals reviewed.       ED Course  Procedures (including critical care time)  DIAGNOSTIC STUDIES: Oxygen Saturation is 96% on RA, normal by my interpretation.    COORDINATION OF CARE: 1:46 PM-Discussed treatment plan which includes updating tetanus, finger xray and pain medication with pt at bedside and pt agreed to plan.  3:52 PM-Spoke with Dr. Caralyn Guile. He will take pt up to surgery to repair finger.    Labs Review Labs Reviewed - No data to display  Imaging Review Dg Finger Index Left  02/03/2014   CLINICAL DATA:  53 year old male with history of trauma to the distal  aspect of the left second finger when 8 200 lb steel bar rolled onto the left index finger this morning at work.  EXAM: LEFT INDEX FINGER 2+V  COMPARISON:  No priors.  FINDINGS: There is complete volar dislocation of the second distal phalanx at the DIP joint. The dorsal aspect of the base of the second distal phalanx appears to rest along the volar aspect of the distal second middle phalanx. No definite acute displaced fracture. Soft tissues are markedly swollen in the distal aspect of the left second finger.  IMPRESSION: 1. Complete volar  dislocation of the distal phalanx of the left second finger.   Electronically Signed   By: Vinnie Langton M.D.   On: 02/03/2014 15:10     EKG Interpretation None      MDM   Final diagnoses:  Encounter for medical assessment  Laceration  Dislocation    Patient with laceration dislocation of left index finger. Patient discussed with Dr. Rogene Houston, who recommends hand surgery consultation. Patient discussed with Dr. Caralyn Guile from hand surgery, who recommends patient be taken to surgery today. Patient is nothing by mouth, pain is currently under control. Dr. Caralyn Guile, to see the patient.  I personally performed the services described in this documentation, which was scribed in my presence. The recorded information has been reviewed and is accurate.  Montine Circle, PA-C 02/03/14 [place], MD 02/04/14 [phone]

## 2014-02-03 NOTE — ED Notes (Signed)
Dropped steel pipe onto left index finger while at work. Sent from [facility] "to see hand specialist".  Finger currently wrapped.

## 2014-02-03 NOTE — H&P (Signed)
Peter Hunt is an 53 y.o. male.   Chief Complaint: left index finger open injury HPI: Pt sustained left index finger injury at work Pt with no prior history to injury to left index finger Pt seen/evaluated and here for surgery No other injuries to left hand  Past Medical History  Diagnosis Date  . Hypertension     Past Surgical History  Procedure Laterality Date  . Shoulder surgery    . Elbow surgery    . Knee surgery      No family history on file. Social History:  reports that he has never smoked. He does not have any smokeless tobacco history on file. He reports that he does not drink alcohol or use illicit drugs.  Allergies:  Allergies  Allergen Reactions  . Ace Inhibitors     REACTION: cough     (Not in a hospital admission)  Results for orders placed or performed during the hospital encounter of 02/03/14 (from the past 48 hour(s))  CBC     Status: None   Collection Time: 02/03/14  4:05 PM  Result Value Ref Range   WBC 6.7 4.0 - 10.5 K/uL   RBC 4.88 4.22 - 5.81 MIL/uL   Hemoglobin 13.9 13.0 - 17.0 g/dL   HCT 41.0 39.0 - 52.0 %   MCV 84.0 78.0 - 100.0 fL   MCH 28.5 26.0 - 34.0 pg   MCHC 33.9 30.0 - 36.0 g/dL   RDW 13.9 11.5 - 15.5 %   Platelets 199 150 - 400 K/uL  Basic metabolic panel     Status: None   Collection Time: 02/03/14  4:05 PM  Result Value Ref Range   Sodium 142 137 - 147 mEq/L   Potassium 4.3 3.7 - 5.3 mEq/L   Chloride 104 96 - 112 mEq/L   CO2 23 19 - 32 mEq/L   Glucose, Bld 96 70 - 99 mg/dL   BUN 13 6 - 23 mg/dL   Creatinine, Ser 0.84 0.50 - 1.35 mg/dL   Calcium 9.5 8.4 - 10.5 mg/dL   GFR calc non Af Amer >90 >90 mL/min   GFR calc Af Amer >90 >90 mL/min    Comment: (NOTE) The eGFR has been calculated using the CKD EPI equation. This calculation has not been validated in all clinical situations. eGFR's persistently <90 mL/min signify possible Chronic Kidney Disease.    Anion gap 15 5 - 15   Dg Chest 2 View  02/03/2014   CLINICAL  DATA:  Pre-op for left index finger irrigation and debridement. History of hypertension. Initial encounter.  EXAM: CHEST  2 VIEW  COMPARISON:  None.  FINDINGS: The heart size and mediastinal contours are normal. The lungs are clear. There is mild blunting of the left costophrenic angle on the frontal examination, but no significant pleural effusion is present on the lateral view. This likely represents mild scarring. There is evidence of previous distal right clavicle resection. No acute osseous findings are demonstrated.  IMPRESSION: No acute cardiopulmonary process.   Electronically Signed   By: Camie Patience M.D.   On: 02/03/2014 18:48   Dg Finger Index Left  02/03/2014   CLINICAL DATA:  53 year old male with history of trauma to the distal aspect of the left second finger when 8 200 lb steel bar rolled onto the left index finger this morning at work.  EXAM: LEFT INDEX FINGER 2+V  COMPARISON:  No priors.  FINDINGS: There is complete volar dislocation of the second distal phalanx at the  DIP joint. The dorsal aspect of the base of the second distal phalanx appears to rest along the volar aspect of the distal second middle phalanx. No definite acute displaced fracture. Soft tissues are markedly swollen in the distal aspect of the left second finger.  IMPRESSION: 1. Complete volar dislocation of the distal phalanx of the left second finger.   Electronically Signed   By: Vinnie Langton M.D.   On: 02/03/2014 15:10    ROS: NO RECENT ILLNESSES OR HOSPITALIZATIONS  Blood pressure 144/94, pulse 78, temperature 97.9 F (36.6 C), temperature source Oral, resp. rate 16, height _0  (1.727 m), weight 83.915 kg (185 lb), SpO2 96 %. Physical Exam  General Appearance:  Alert, cooperative, no distress, appears stated age  Head:  Normocephalic, without obvious abnormality, atraumatic  Eyes:  Pupils equal, conjunctiva/corneas clear,         Throat: Lips, mucosa, and tongue normal; teeth and gums normal  Neck: No  visible masses     Lungs:   respirations unlabored  Chest Wall:  No tenderness or deformity  Heart:  Regular rate and rhythm,  Abdomen:   Soft, non-tender,         Extremities: LEFT INDEX FINGER: IN BANDAGE, FINGER TIP WARM WELL PERFUSED PICTURES REVIEWED SHOW INJURY NO INJURY TO LONG/RING/SMALL FINGERS GOOD THUMB MOBILITY  Pulses: 2+ and symmetric  Skin: Skin color, texture, turgor normal, no rashes or lesions     Neurologic: Normal     Assessment/Plan LEFT INDEX FINGER OPEN DIP DISLOCATION AND SOFT TISSUE INJURY, EXTENSOR MECHANISM INJURY  LEFT INDEX FINGER OPEN DEBRIDEMENT AND REPAIR AS INDICATED AND PINNING  R/B/A DISCUSSED WITH PT IN OFFICE.  PT VOICED UNDERSTANDING OF PLAN CONSENT SIGNED DAY OF SURGERY PT SEEN AND EXAMINED PRIOR TO OPERATIVE PROCEDURE/DAY OF SURGERY SITE MARKED. QUESTIONS ANSWERED WILL GO HOME FOLLOWING SURGERY  WE ARE PLANNING SURGERY FOR YOUR UPPER EXTREMITY. THE RISKS AND BENEFITS OF SURGERY INCLUDE BUT NOT LIMITED TO BLEEDING INFECTION, DAMAGE TO NEARBY NERVES ARTERIES TENDONS, FAILURE OF SURGERY TO ACCOMPLISH ITS INTENDED GOALS, PERSISTENT SYMPTOMS AND NEED FOR FURTHER SURGICAL INTERVENTION. WITH THIS IN MIND WE WILL PROCEED. I HAVE DISCUSSED WITH THE PATIENT THE PRE AND POSTOPERATIVE REGIMEN AND THE DOS AND DON'TS. PT VOICED UNDERSTANDING AND INFORMED CONSENT SIGNED.  Linna Hoff 02/03/2014, 8:22 PM

## 2014-02-03 NOTE — ED Notes (Signed)
Per Rob, [place] pt to go to OR.  Pt made aware of plan of care.

## 2014-02-04 NOTE — Op Note (Signed)
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  ATTENDING PHYSICIAN:  Linna Hoff IV, MD, who was scrubbed and present for the entire procedure.  ASSISTANT SURGEON:  None.  ANESTHESIA:  General via LMA.  TOURNIQUET TIME:  Less than 35 minutes at 250 mmHg.  PROCEDURE: 1. Left index finger debridement of skin, subcutaneous tissue, and     bone associated with open distal interphalangeal joint dislocation     and volar dislocation. 2. Repair of the left index finger extensor terminal tendon. 3. Open treatment of left index finger dislocation with internal     fixation. 4. Repair of left index finger collateral ligament, interphalangeal     joint collateral ligament. 5. Left index finger nail bed repair. 6. Radiographs 2 views, left index finger. 7. Traumatic laceration repair, 4 cm.  RADIOGRAPHIC INTERPRETATION:  AP and lateral views of the finger do show the 2 suture anchors in place as well as the K-wire transfixing the distal interphalangeal joint.  SURGICAL INDICATIONS:  Mr. Peter Hunt is a right-hand-dominant gentleman who sustained a near amputation to the left index finger.  The patient was seen and evaluated in the emergency department and recommend to undergo the above procedure.  Risks, benefits, and alternatives were discussed in detail with the patient.  Signed informed consent was  obtained. Risks include, but not limited to bleeding, infection; damage to nearby nerves, arteries, or tendons; loss of motion of wrist and digits, incomplete relief of symptoms, and need for further surgical intervention.  DESCRIPTION OF PROCEDURE:  The patient was properly identified in the preoperative holding area and marked with a permanent marker made on left index finger to indicate the correct operative site.  The patient was brought back to the operating room, placed supine on the anesthesia table where general anesthesia was administered.  The patient received preoperative antibiotics prior to skin incision.  A well-padded tourniquet was placed on the left brachium, sealed with 1000 drape. Left upper extremity was then prepped and draped in normal sterile fashion.  Time-out was called, correct side was identified, and procedure begun.  Attention then turned to the index finger.  The traumatic laceration was extended proximally.  The 4 cm laceration was extended exposing the extensor mechanism.  The patient did have the open unstable interphalangeal joint distally.  The excisional debridement of the skin, subcutaneous tissue, and bone were then carried out with the open dislocation.  Once this was carried out __________ of the devitalized soft tissues, the nail plate was removed for exposure, exposing the germinal matrix.  Once this was exposed, the patient did have the avulsed __________ insertion  of the extensor tendon mechanism. The joint was then thoroughly irrigated.  A 0.045 K-wire was then placed in an antegrade and then retrograde across the joint stabilizing the interphalangeal joint dislocation.  After open treatment of the IP dislocation, 2 suture anchors were then placed adjacent to the K-wires, 1 on each side to __________ the extensor tendon.  The extensor tendon was then carefully advanced and tied down with the FiberWire suture. The nail bed was then repaired  using 5-0 chromic suture.  The patient did have partial avulsion of the germinal matrix.  Once this was done, nail bed was __________ and the extensor mechanism stabilized.  K-wire was then cut and then left underneath the skin.  Final radiographs were then obtained.  The wound was then thoroughly irrigated.  The collateral ligament was then repaired with chromic sutures and the skin was then repaired with 4-0 Vicryl Rapide sutures.  5 mL of 0.25% Marcaine flexor sheath block anesthetized proximally.  Sterile compressive bandage was then applied.  The patient was then placed in a small finger splint, extubated, and taken to recovery room in good condition.  POSTPROCEDURE PLAN:  The patient discharged to home, seen back in the office in approximately 8 days for wound check, application of a small tip protector splint, x-rays, and then begin a therapy regimen.  Guarded prognosis in terms of function of the distal interphalangeal joint.  The fingers will get stiff at the DIP joint leaving him some terminal extension __________ finger tip, but he will likely have an impairment with loss of movement at the terminal DIP joint.  Radiographs at each visit.     Melrose Nakayama, MD     FWO/MEDQ  D:  02/03/2014  T:  02/04/2014  Job:  [id number]

## 2014-02-08 ENCOUNTER — Encounter (HOSPITAL_COMMUNITY): Payer: Self-pay | Admitting: Orthopedic Surgery

## 2014-03-04 ENCOUNTER — Ambulatory Visit (HOSPITAL_BASED_OUTPATIENT_CLINIC_OR_DEPARTMENT_OTHER)
Admission: RE | Admit: 2014-03-04 | Discharge: 2014-03-04 | Disposition: A | Discharge status: Home / Self Care | Payer: Worker's Compensation | Source: Ambulatory Visit | Attending: Orthopedic Surgery | Admitting: Orthopedic Surgery

## 2014-03-04 ENCOUNTER — Ambulatory Visit (HOSPITAL_BASED_OUTPATIENT_CLINIC_OR_DEPARTMENT_OTHER): Payer: Worker's Compensation | Admitting: Anesthesiology

## 2014-03-04 ENCOUNTER — Encounter (HOSPITAL_BASED_OUTPATIENT_CLINIC_OR_DEPARTMENT_OTHER)
Admission: RE | Disposition: A | Discharge status: Home / Self Care | Payer: Self-pay | Source: Ambulatory Visit | Attending: Orthopedic Surgery

## 2014-03-04 ENCOUNTER — Encounter (HOSPITAL_BASED_OUTPATIENT_CLINIC_OR_DEPARTMENT_OTHER): Payer: Self-pay | Admitting: *Deleted

## 2014-03-04 DIAGNOSIS — L089 Local infection of the skin and subcutaneous tissue, unspecified: Secondary | ICD-10-CM | POA: Insufficient documentation

## 2014-03-04 DIAGNOSIS — B9561 Methicillin susceptible Staphylococcus aureus infection as the cause of diseases classified elsewhere: Secondary | ICD-10-CM | POA: Insufficient documentation

## 2014-03-04 DIAGNOSIS — I10 Essential (primary) hypertension: Secondary | ICD-10-CM | POA: Insufficient documentation

## 2014-03-04 DIAGNOSIS — Z87891 Personal history of nicotine dependence: Secondary | ICD-10-CM | POA: Insufficient documentation

## 2014-03-04 DIAGNOSIS — S62631S Displaced fracture of distal phalanx of left index finger, sequela: Secondary | ICD-10-CM | POA: Diagnosis not present

## 2014-03-04 HISTORY — PX: INCISION AND DRAINAGE OF WOUND: SHX1803

## 2014-03-04 HISTORY — DX: Local infection of the skin and subcutaneous tissue, unspecified: L08.9

## 2014-03-04 HISTORY — DX: Presence of spectacles and contact lenses: Z97.3

## 2014-03-04 HISTORY — DX: Other specified personal risk factors, not elsewhere classified: Z91.89

## 2014-03-04 SURGERY — IRRIGATION AND DEBRIDEMENT WOUND
Anesthesia: General | Site: Finger | Laterality: Left

## 2014-03-04 MED ORDER — FENTANYL CITRATE 0.05 MG/ML IJ SOLN
INTRAMUSCULAR | Status: DC | PRN
  Administered 2014-03-04 (×2): 50 ug via INTRAVENOUS

## 2014-03-04 MED ORDER — CEFAZOLIN SODIUM-DEXTROSE 2-3 GM-% IV SOLR
2.0000 g | INTRAVENOUS | Status: DC
  Filled 2014-03-04: qty 50

## 2014-03-04 MED ORDER — FENTANYL CITRATE 0.05 MG/ML IJ SOLN
INTRAMUSCULAR | Status: AC
  Filled 2014-03-04: qty 4

## 2014-03-04 MED ORDER — LACTATED RINGERS IV SOLN
INTRAVENOUS | Status: DC
  Administered 2014-03-04: 14:00:00 via INTRAVENOUS
  Filled 2014-03-04: qty 1000

## 2014-03-04 MED ORDER — SODIUM CHLORIDE 0.9 % IR SOLN
Status: DC | PRN
  Administered 2014-03-04 (×2): 500 mL

## 2014-03-04 MED ORDER — CEPHALEXIN 500 MG PO CAPS: 500.0000 mg | ORAL_CAPSULE | Freq: Four times a day (QID) | ORAL | Status: DC

## 2014-03-04 MED ORDER — CEFAZOLIN SODIUM-DEXTROSE 2-3 GM-% IV SOLR
2.0000 g | INTRAVENOUS | Status: AC
  Administered 2014-03-04: 2 g via INTRAVENOUS
  Filled 2014-03-04: qty 50

## 2014-03-04 MED ORDER — BUPIVACAINE HCL (PF) 0.25 % IJ SOLN
INTRAMUSCULAR | Status: DC | PRN
  Administered 2014-03-04: 5 mL

## 2014-03-04 MED ORDER — LIDOCAINE HCL (CARDIAC) 20 MG/ML IV SOLN
INTRAVENOUS | Status: DC | PRN
  Administered 2014-03-04: 100 mg via INTRAVENOUS

## 2014-03-04 MED ORDER — ONDANSETRON HCL 4 MG/2ML IJ SOLN
INTRAMUSCULAR | Status: DC | PRN
  Administered 2014-03-04: 4 mg via INTRAVENOUS

## 2014-03-04 MED ORDER — METOCLOPRAMIDE HCL 5 MG/ML IJ SOLN
INTRAMUSCULAR | Status: DC | PRN
  Administered 2014-03-04: 10 mg via INTRAVENOUS

## 2014-03-04 MED ORDER — HYDROCODONE-ACETAMINOPHEN 5-300 MG PO TABS: 1.0000 | ORAL_TABLET | Freq: Four times a day (QID) | ORAL | Status: DC | PRN

## 2014-03-04 MED ORDER — PROPOFOL 10 MG/ML IV BOLUS
INTRAVENOUS | Status: DC | PRN
  Administered 2014-03-04: 150 mg via INTRAVENOUS

## 2014-03-04 MED ORDER — DEXAMETHASONE SODIUM PHOSPHATE 4 MG/ML IJ SOLN
INTRAMUSCULAR | Status: DC | PRN
  Administered 2014-03-04: 10 mg via INTRAVENOUS

## 2014-03-04 MED ORDER — ACETAMINOPHEN 10 MG/ML IV SOLN
INTRAVENOUS | Status: DC | PRN
  Administered 2014-03-04: 1000 mg via INTRAVENOUS

## 2014-03-04 MED ORDER — FENTANYL CITRATE 0.05 MG/ML IJ SOLN
25.0000 ug | INTRAMUSCULAR | Status: DC | PRN
  Filled 2014-03-04: qty 1

## 2014-03-04 MED ORDER — MIDAZOLAM HCL 2 MG/2ML IJ SOLN
INTRAMUSCULAR | Status: AC
  Filled 2014-03-04: qty 2

## 2014-03-04 MED ORDER — CEFAZOLIN SODIUM-DEXTROSE 2-3 GM-% IV SOLR
INTRAVENOUS | Status: AC
  Filled 2014-03-04: qty 50

## 2014-03-04 MED ORDER — ONDANSETRON HCL 4 MG/2ML IJ SOLN
4.0000 mg | Freq: Once | INTRAMUSCULAR | Status: DC | PRN
  Filled 2014-03-04: qty 2

## 2014-03-04 MED ORDER — MIDAZOLAM HCL 5 MG/5ML IJ SOLN
INTRAMUSCULAR | Status: DC | PRN
  Administered 2014-03-04: 2 mg via INTRAVENOUS

## 2014-03-04 MED ORDER — CHLORHEXIDINE GLUCONATE 4 % EX LIQD
60.0000 mL | Freq: Once | CUTANEOUS | Status: DC
  Filled 2014-03-04: qty 60

## 2014-03-04 SURGICAL SUPPLY — 38 items
BLADE SURG 15 STRL LF DISP TIS (BLADE) ×1 IMPLANT
BLADE SURG 15 STRL SS (BLADE) ×2
BNDG COHESIVE 1X5 TAN STRL LF (GAUZE/BANDAGES/DRESSINGS) ×3 IMPLANT
BNDG CONFORM 2 STRL LF (GAUZE/BANDAGES/DRESSINGS) ×3 IMPLANT
BNDG ESMARK 4X9 LF (GAUZE/BANDAGES/DRESSINGS) ×3 IMPLANT
CORDS BIPOLAR (ELECTRODE) ×3 IMPLANT
CUFF TOURNIQUET SINGLE 18IN (TOURNIQUET CUFF) ×3 IMPLANT
DRAPE EXTREMITY T 121X128X90 (DRAPE) ×3 IMPLANT
DRAPE SURG 17X23 STRL (DRAPES) ×3 IMPLANT
DRSG EMULSION OIL 3X3 NADH (GAUZE/BANDAGES/DRESSINGS) ×3 IMPLANT
GLOVE BIO SURGEON STRL SZ8 (GLOVE) ×3 IMPLANT
GLOVE BIOGEL PI IND STRL 6.5 (GLOVE) ×1 IMPLANT
GLOVE BIOGEL PI IND STRL 7.5 (GLOVE) ×1 IMPLANT
GLOVE BIOGEL PI IND STRL 8.5 (GLOVE) ×1 IMPLANT
GLOVE BIOGEL PI INDICATOR 6.5 (GLOVE) ×2
GLOVE BIOGEL PI INDICATOR 7.5 (GLOVE) ×2
GLOVE BIOGEL PI INDICATOR 8.5 (GLOVE) ×2
GLOVE SURG SS PI 7.5 STRL IVOR (GLOVE) ×3 IMPLANT
GOWN STRL REUS W/ TWL LRG LVL3 (GOWN DISPOSABLE) ×1 IMPLANT
GOWN STRL REUS W/ TWL XL LVL3 (GOWN DISPOSABLE) ×1 IMPLANT
GOWN STRL REUS W/TWL LRG LVL3 (GOWN DISPOSABLE) ×2
GOWN STRL REUS W/TWL XL LVL3 (GOWN DISPOSABLE) ×2
NEEDLE HYPO 25X1 1.5 SAFETY (NEEDLE) ×3 IMPLANT
NS IRRIG 500ML POUR BTL (IV SOLUTION) ×6 IMPLANT
PACK BASIN DAY SURGERY FS (CUSTOM PROCEDURE TRAY) ×3 IMPLANT
SPONGE GAUZE 4X4 12PLY STER LF (GAUZE/BANDAGES/DRESSINGS) ×3 IMPLANT
STOCKINETTE 4X48 STRL (DRAPES) ×3 IMPLANT
SUCTION FRAZIER TIP 10 FR DISP (SUCTIONS) ×3 IMPLANT
SUT PROLENE 4 0 PS 2 18 (SUTURE) ×3 IMPLANT
SWAB CULTURE LIQ STUART DBL (MISCELLANEOUS) ×3 IMPLANT
SYR BULB 3OZ (MISCELLANEOUS) ×3 IMPLANT
SYR CONTROL 10ML LL (SYRINGE) ×3 IMPLANT
TOWEL OR 17X24 6PK STRL BLUE (TOWEL DISPOSABLE) ×6 IMPLANT
TRAY DSU PREP LF (CUSTOM PROCEDURE TRAY) ×3 IMPLANT
TUBE ANAEROBIC SPECIMEN COL (MISCELLANEOUS) ×3 IMPLANT
TUBE CONNECTING 12'X1/4 (SUCTIONS) ×1
TUBE CONNECTING 12X1/4 (SUCTIONS) ×2 IMPLANT
UNDERPAD 30X30 INCONTINENT (UNDERPADS AND DIAPERS) ×3 IMPLANT

## 2014-03-04 NOTE — Progress Notes (Signed)
   03/04/14 0953  OBSTRUCTIVE SLEEP APNEA  Have you ever been diagnosed with sleep apnea through a sleep study? No  Do you snore loudly (loud enough to be heard through closed doors)?  0  Do you often feel tired, fatigued, or sleepy during the daytime? 0  Has anyone observed you stop breathing during your sleep? 0  Do you have, or are you being treated for high blood pressure? 1  BMI more than 35 kg/m2? 0  Age over 54 years old? 1  Neck circumference greater than 40 cm/16 inches? 1  Gender: 1  Obstructive Sleep Apnea Score 4  Score 4 or greater  Results sent to PCP

## 2014-03-04 NOTE — Anesthesia Preprocedure Evaluation (Signed)
Anesthesia Evaluation  Patient identified by MRN, date of birth, ID band Patient awake    Reviewed: Allergy & Precautions, NPO status , Patient's Chart, lab work & pertinent test results  History of Anesthesia Complications Negative for: history of anesthetic complications  Airway Mallampati: II  TM Distance: >3 FB Neck ROM: Full    Dental no notable dental hx. (+) Upper Dentures, Lower Dentures   Pulmonary former smoker,  breath sounds clear to auscultation  Pulmonary exam normal       Cardiovascular hypertension, Pt. on medications Rhythm:Regular Rate:Normal     Neuro/Psych negative neurological ROS  negative psych ROS   GI/Hepatic negative GI ROS, Neg liver ROS,   Endo/Other  negative endocrine ROS  Renal/GU negative Renal ROS  negative genitourinary   Musculoskeletal negative musculoskeletal ROS (+)   Abdominal   Peds negative pediatric ROS (+)  Hematology negative hematology ROS (+)   Anesthesia Other Findings   Reproductive/Obstetrics negative OB ROS                             Anesthesia Physical Anesthesia Plan  ASA: II  Anesthesia Plan: General   Post-op Pain Management:    Induction: Intravenous  Airway Management Planned: LMA  Additional Equipment:   Intra-op Plan:   Post-operative Plan: Extubation in OR  Informed Consent: I have reviewed the patients History and Physical, chart, labs and discussed the procedure including the risks, benefits and alternatives for the proposed anesthesia with the patient or authorized representative who has indicated his/her understanding and acceptance.   Dental advisory given  Plan Discussed with: CRNA  Anesthesia Plan Comments:         Anesthesia Quick Evaluation

## 2014-03-04 NOTE — Progress Notes (Signed)
SPOKE W/ PT, HAS 8 OZ ORANGE JUICE W/ NORVASC AT 0715 TODAY, THIS WAS HIS LAST LIQUID. LAST SOLID YESTERDAY AT 2200.  PT VERBALIZED UNDERSTANDING NPO TODAY INCLUDING NO WATER , CANDY , OR GUM.  CURRENT LAB RESULTS AND EKG IN CHART AND EPIC.  WILL ARRIVE AT 1230.

## 2014-03-04 NOTE — H&P (Signed)
Peter Hunt is an 54 y.o. male.   Chief Complaint: LEFT INDEX FINGER DRAINING WOUND HPI: PT UNDERWENT LEFT INDEX FINGER RECONSTRUCTION AFTER WORK RELATED INJURY PT HAS BEEN FOLLOWED IN OFFICE PT WITH PERSISTENT DRAINING WOUND  HERE FOR SURGERY  Past Medical History  Diagnosis Date  . Hypertension   . Infection of index finger     left--  s/p  I&D and Tendon Repair and closure laceration  . Wears glasses   . At risk for sleep apnea     STOP-BANG= 4     SENT TO PCP 03-04-2014    Past Surgical History  Procedure Laterality Date  . I&d extremity Left 02/03/2014    Procedure: IRRIGATION AND DEBRIDEMENT LEFT INDEX FINGER,  REPAIR TENDON LEFT INDEX FINGER;  Surgeon: Linna Hoff, MD;  Location: [facility];  Service: Orthopedics;  Laterality: Left;  . Percutaneous pinning Left 02/03/2014    Procedure: PERCUTANEOUS PINNING LEFT INDEX FINGER;  Surgeon: Linna Hoff, MD;  Location: [facility];  Service: Orthopedics;  Laterality: Left;  . Calcaneal osteotomy with iliac crest bone graft and repair sublexing tendon and achilles tendon Right 11-04-2002  . Shoulder arthroscopy Bilateral right x2/   left x1--  1990's  . Elbow surgery Right 1996    History reviewed. No pertinent family history. Social History:  reports that he quit smoking about 20 years ago. His smoking use included Cigarettes. He has a 33 pack-year smoking history. He has never used smokeless tobacco. He reports that he does not drink alcohol or use illicit drugs.  Allergies:  Allergies  Allergen Reactions  . Ace Inhibitors Other (See Comments)    REACTION: cough    Medications Prior to Admission  Medication Sig Dispense Refill  . amLODipine (NORVASC) 5 MG tablet Take 5 mg by mouth every morning.     . cephALEXin (KEFLEX) 500 MG capsule Take 1 capsule (500 mg total) by mouth 4 (four) times daily. 40 capsule 0  . docusate sodium (COLACE) 100 MG capsule Take 1 capsule (100 mg total) by mouth 2 (two) times daily. (Patient taking  differently: Take 100 mg by mouth 2 (two) times daily as needed. ) 30 capsule 0  . Hydrocodone-Acetaminophen (VICODIN) 5-300 MG TABS Take 1 tablet by mouth 4 (four) times daily as needed (PAIN). 40 each 0    No results found for this or any previous visit (from the past 48 hour(s)). No results found.  ROSNO RECENT ILLNESSES OR HOSPITALIZATIONS  Blood pressure 146/86, pulse 71, temperature 98.2 F (36.8 C), temperature source Oral, resp. rate 16, height 5\' 8"  (1.727 m), weight 85.73 kg (189 lb), SpO2 100 %. Physical Exam  General Appearance:  Alert, cooperative, no distress, appears stated age  Head:  Normocephalic, without obvious abnormality, atraumatic  Eyes:  Pupils equal, conjunctiva/corneas clear,         Throat: Lips, mucosa, and tongue normal; teeth and gums normal  Neck: No visible masses     Lungs:   respirations unlabored  Chest Wall:  No tenderness or deformity  Heart:  Regular rate and rhythm,  Abdomen:   Soft, non-tender,         Extremities: LEFT INDEX FINGER WARM WELL PERFUSED +PURULENT DRAINAGE FROM AROUND EPONYCHIUM FINGER TIP PINK LIMITED PIP AND DIP MOTION FINGER SWOLLEN  Pulses: 2+ and symmetric  Skin: Skin color, texture, turgor normal, no rashes or lesions     Neurologic: Normal   Assessment/Plan LEFT INDEX FINGER INFECTION AFTER FINGER TIP RECONSTRUCTION  LEFT  INDEX FINGER INCISION AND DRAINAGE AND DEBRIDEMENT  R/B/A DISCUSSED WITH PT IN OFFICE.  PT VOICED UNDERSTANDING OF PLAN CONSENT SIGNED DAY OF SURGERY PT SEEN AND EXAMINED PRIOR TO OPERATIVE PROCEDURE/DAY OF SURGERY SITE MARKED. QUESTIONS ANSWERED WILL GO HOME FOLLOWING SURGERY  WE ARE PLANNING SURGERY FOR YOUR UPPER EXTREMITY. THE RISKS AND BENEFITS OF SURGERY INCLUDE BUT NOT LIMITED TO BLEEDING INFECTION, DAMAGE TO NEARBY NERVES ARTERIES TENDONS, FAILURE OF SURGERY TO ACCOMPLISH ITS INTENDED GOALS, PERSISTENT SYMPTOMS AND NEED FOR FURTHER SURGICAL INTERVENTION. WITH THIS IN MIND WE WILL  PROCEED. I HAVE DISCUSSED WITH THE PATIENT THE PRE AND POSTOPERATIVE REGIMEN AND THE DOS AND DON'TS. PT VOICED UNDERSTANDING AND INFORMED CONSENT SIGNED.  Linna Hoff 03/04/2014, 1:21 PM

## 2014-03-04 NOTE — Transfer of Care (Signed)
Immediate Anesthesia Transfer of Care Note Immediate Anesthesia Transfer of Care Note  Patient: Peter Hunt  Procedure(s) Performed: Procedure(s) (LRB): IRRIGATION AND DEBRIDEMENT LEFT INDEX FINGER WOUND (Left)  Patient Location: PACU  Anesthesia Type: General  Level of Consciousness: awake, alert  and oriented  Airway & Oxygen Therapy: Patient Spontanous Breathing and Patient connected to face mask oxygen  Post-op Assessment: Report given to PACU RN and Post -op Vital signs reviewed and stable  Post vital signs: Reviewed and stable  Complications: No apparent anesthesia complications

## 2014-03-04 NOTE — Brief Op Note (Signed)
03/04/2014  1:24 PM  PATIENT:  Peter Hunt  54 y.o. male  PRE-OPERATIVE DIAGNOSIS:  left index finger infection  POST-OPERATIVE DIAGNOSIS:  * No post-op diagnosis entered *  PROCEDURE:  Procedure(s) with comments: IRRIGATION AND DEBRIDEMENT WOUND (Left) - i & d left index finger   SURGEON:  Surgeon(s) and Role:    * Linna Hoff, MD - Primary  PHYSICIAN ASSISTANT:   ASSISTANTS: none   ANESTHESIA:   general  EBL:     BLOOD ADMINISTERED:none  DRAINS: none   LOCAL MEDICATIONS USED:  MARCAINE     SPECIMEN:  No Specimen  DISPOSITION OF SPECIMEN:  N/A  COUNTS:  YES  TOURNIQUET:    DICTATION: .Other Dictation: Dictation Number [id number]  PLAN OF CARE: Discharge to home after PACU  PATIENT DISPOSITION:  PACU - hemodynamically stable.   Delay start of Pharmacological VTE agent (>24hrs) due to surgical blood loss or risk of bleeding: not applicable

## 2014-03-04 NOTE — Discharge Instructions (Signed)
KEEP BANDAGE CLEAN AND DRY CALL OFFICE FOR F/U APPT [phone] in 7 days Dr Caralyn Guile cell [phone] KEEP HAND ELEVATED ABOVE HEART OK TO APPLY ICE TO OPERATIVE AREA CONTACT OFFICE IF ANY WORSENING PAIN OR CONCERNS . Post Anesthesia Home Care Instructions  Activity: Get plenty of rest for the remainder of the day. A responsible adult should stay with you for 24 hours following the procedure.  For the next 24 hours, DO NOT: -Drive a car -Paediatric nurse -Drink alcoholic beverages -Take any medication unless instructed by your physician -Make any legal decisions or sign important papers.  Meals: Start with liquid foods such as gelatin or soup. Progress to regular foods as tolerated. Avoid greasy, spicy, heavy foods. If nausea and/or vomiting occur, drink only clear liquids until the nausea and/or vomiting subsides. Call your physician if vomiting continues.  Special Instructions/Symptoms: Your throat may feel dry or sore from the anesthesia or the breathing tube placed in your throat during surgery. If this causes discomfort, gargle with warm salt water. The discomfort should disappear within 24 hours.

## 2014-03-04 NOTE — Anesthesia Procedure Notes (Signed)
Procedure Name: LMA Insertion Date/Time: 03/04/2014 3:31 PM Performed by: Mechele Claude Pre-anesthesia Checklist: Patient identified, Emergency Drugs available, Suction available and Patient being monitored Patient Re-evaluated:Patient Re-evaluated prior to inductionOxygen Delivery Method: Circle System Utilized Preoxygenation: Pre-oxygenation with 100% oxygen Intubation Type: IV induction Ventilation: Mask ventilation without difficulty LMA: LMA inserted LMA Size: 5.0 Number of attempts: 1 Airway Equipment and Method: bite block Placement Confirmation: positive ETCO2 Tube secured with: Tape Dental Injury: Teeth and Oropharynx as per pre-operative assessment

## 2014-03-05 ENCOUNTER — Encounter (HOSPITAL_BASED_OUTPATIENT_CLINIC_OR_DEPARTMENT_OTHER): Payer: Self-pay | Admitting: Orthopedic Surgery

## 2014-03-05 NOTE — Op Note (Signed)
NAMELYNNWOOD, BECKFORD                 ACCOUNT NO.:  [id number]  MEDICAL RECORD NO.:  [id number]  LOCATION:                                 FACILITY:  PHYSICIAN:  Melrose Nakayama, MD  DATE OF BIRTH:  11-25-1960  DATE OF PROCEDURE:  03/04/2014 DATE OF DISCHARGE:  03/04/2014                              OPERATIVE REPORT   PREOPERATIVE DIAGNOSIS:  Left index finger infection with open distal phalanx fracture.  POSTOPERATIVE DIAGNOSIS:  Left index finger infection with open distal phalanx fracture.  ATTENDING PHYSICIAN:  Linna Hoff IV, MD, who was scrubbed and present for the entire procedure.  ASSISTANT SURGEON:  None.  ANESTHESIA:  General via LMA.  SURGICAL PROCEDURE: 1. Debridement of skin, subcutaneous tissue, muscle, and bone     associated with open and distal phalangeal fracture dislocation. 2. Left index finger distal interphalangeal joint arthrotomy,     exploration, and drainage. 3. Left index finger nail plate and then debridement.  SURGICAL INDICATIONS:  Mr. Hinton is a 54 year old gentleman who underwent reconstruction of his left index finger following a near amputation. The patient was followed in the office and unfortunately developed worsening pain, swelling.  He was recommended to undergo the above procedure.  Risks, benefits, and alternatives were discussed in detail with the patient.  Signed informed consent was obtained.  Risks include, but not limited to bleeding, infection; damage to nearby nerves, arteries, tendons; loss of motion of wrist and digits, incomplete relief of symptoms, need for further surgical intervention.  DESCRIPTION OF PROCEDURE:  The patient was properly identified in the preoperative holding area and marked with permanent marker made on left index finger to indicate correct operative site.  The patient was then brought back to the operating room, placed supine on the anesthesia table where the IV sedation was administered.   General anesthetic was administered.  The patient tolerated this well.  A well-padded tourniquet was placed on left brachium and sealed with 1000 drape.  Left upper extremity was then prepped and draped in normal sterile fashion. Time-out was called.  Correct site was identified, and procedure then begun.  Attention then turned to the left index finger and the skin flap was then raised and then excisional debridement of skin, subcutaneous tissue, all the way down to the bone was then carried out.  This was done sharply with knife and scissors.  The joint was opened.  Arthrotomy was then carried out and the joint was then irrigated thoroughly.  The patient did have the K-wire across the joint, did not remove the K-wire, fixation appeared to be in good position.  The nail plate was then debrided.  The nail plate was removed and nail matrix was then debrided. This was done sharply with a sharp knife.  The wound was then thoroughly irrigated.  Wound cultures were then taken.  After the copious wound irrigation, the skin was then loosely reapproximated with Prolene sutures.  Adaptic dressing and sterile compressive bandage were applied. 5 mL of 0.25% Marcaine block was then performed.  The patient was then placed in a small finger dressing, extubated, and taken to recovery room in good condition.  POSTPROCEDURE PLAN:  The patient discharged to home, seen back in the office in 1 week.  Wound check, x-rays, and then back into his protective splint.  Continue with the oral antibiotics.  Continue to follow him closely with the wound.  We will follow up on his wound cultures.     Melrose Nakayama, MD     FWO/MEDQ  D:  03/04/2014  T:  03/05/2014  Job:  [id number]

## 2014-03-05 NOTE — Anesthesia Postprocedure Evaluation (Signed)
  Anesthesia Post-op Note  Patient: Peter Hunt  Procedure(s) Performed: Procedure(s) (LRB): IRRIGATION AND DEBRIDEMENT LEFT INDEX FINGER WOUND (Left)  Patient Location: PACU  Anesthesia Type: General  Level of Consciousness: awake and alert   Airway and Oxygen Therapy: Patient Spontanous Breathing  Post-op Pain: mild  Post-op Assessment: Post-op Vital signs reviewed, Patient's Cardiovascular Status Stable, Respiratory Function Stable, Patent Airway and No signs of Nausea or vomiting  Last Vitals:  Filed Vitals:   03/04/14 1709  BP: 141/88  Pulse: 78  Temp: 36.6 C  Resp: 20    Post-op Vital Signs: stable   Complications: No apparent anesthesia complications

## 2014-03-07 LAB — WOUND CULTURE

## 2014-03-09 LAB — ANAEROBIC CULTURE

## 2016-06-15 ENCOUNTER — Other Ambulatory Visit: Payer: Self-pay | Admitting: Ophthalmology

## 2016-06-15 DIAGNOSIS — M7541 Impingement syndrome of right shoulder: Secondary | ICD-10-CM

## 2016-06-24 ENCOUNTER — Ambulatory Visit
Admission: RE | Admit: 2016-06-24 | Discharge: 2016-06-24 | Disposition: A | Discharge status: Home / Self Care | Payer: Self-pay | Source: Ambulatory Visit | Attending: Ophthalmology | Admitting: Ophthalmology

## 2016-06-24 DIAGNOSIS — M7541 Impingement syndrome of right shoulder: Secondary | ICD-10-CM

## 2016-06-29 IMAGING — CR DG CHEST 2V
2 series · 2 of 2 positions shown · non-contrast
Comparison: None.

CLINICAL DATA: Pre-op for left index finger irrigation and
debridement. History of hypertension. Initial encounter.

EXAM:
CHEST  2 VIEW

[chest pa]
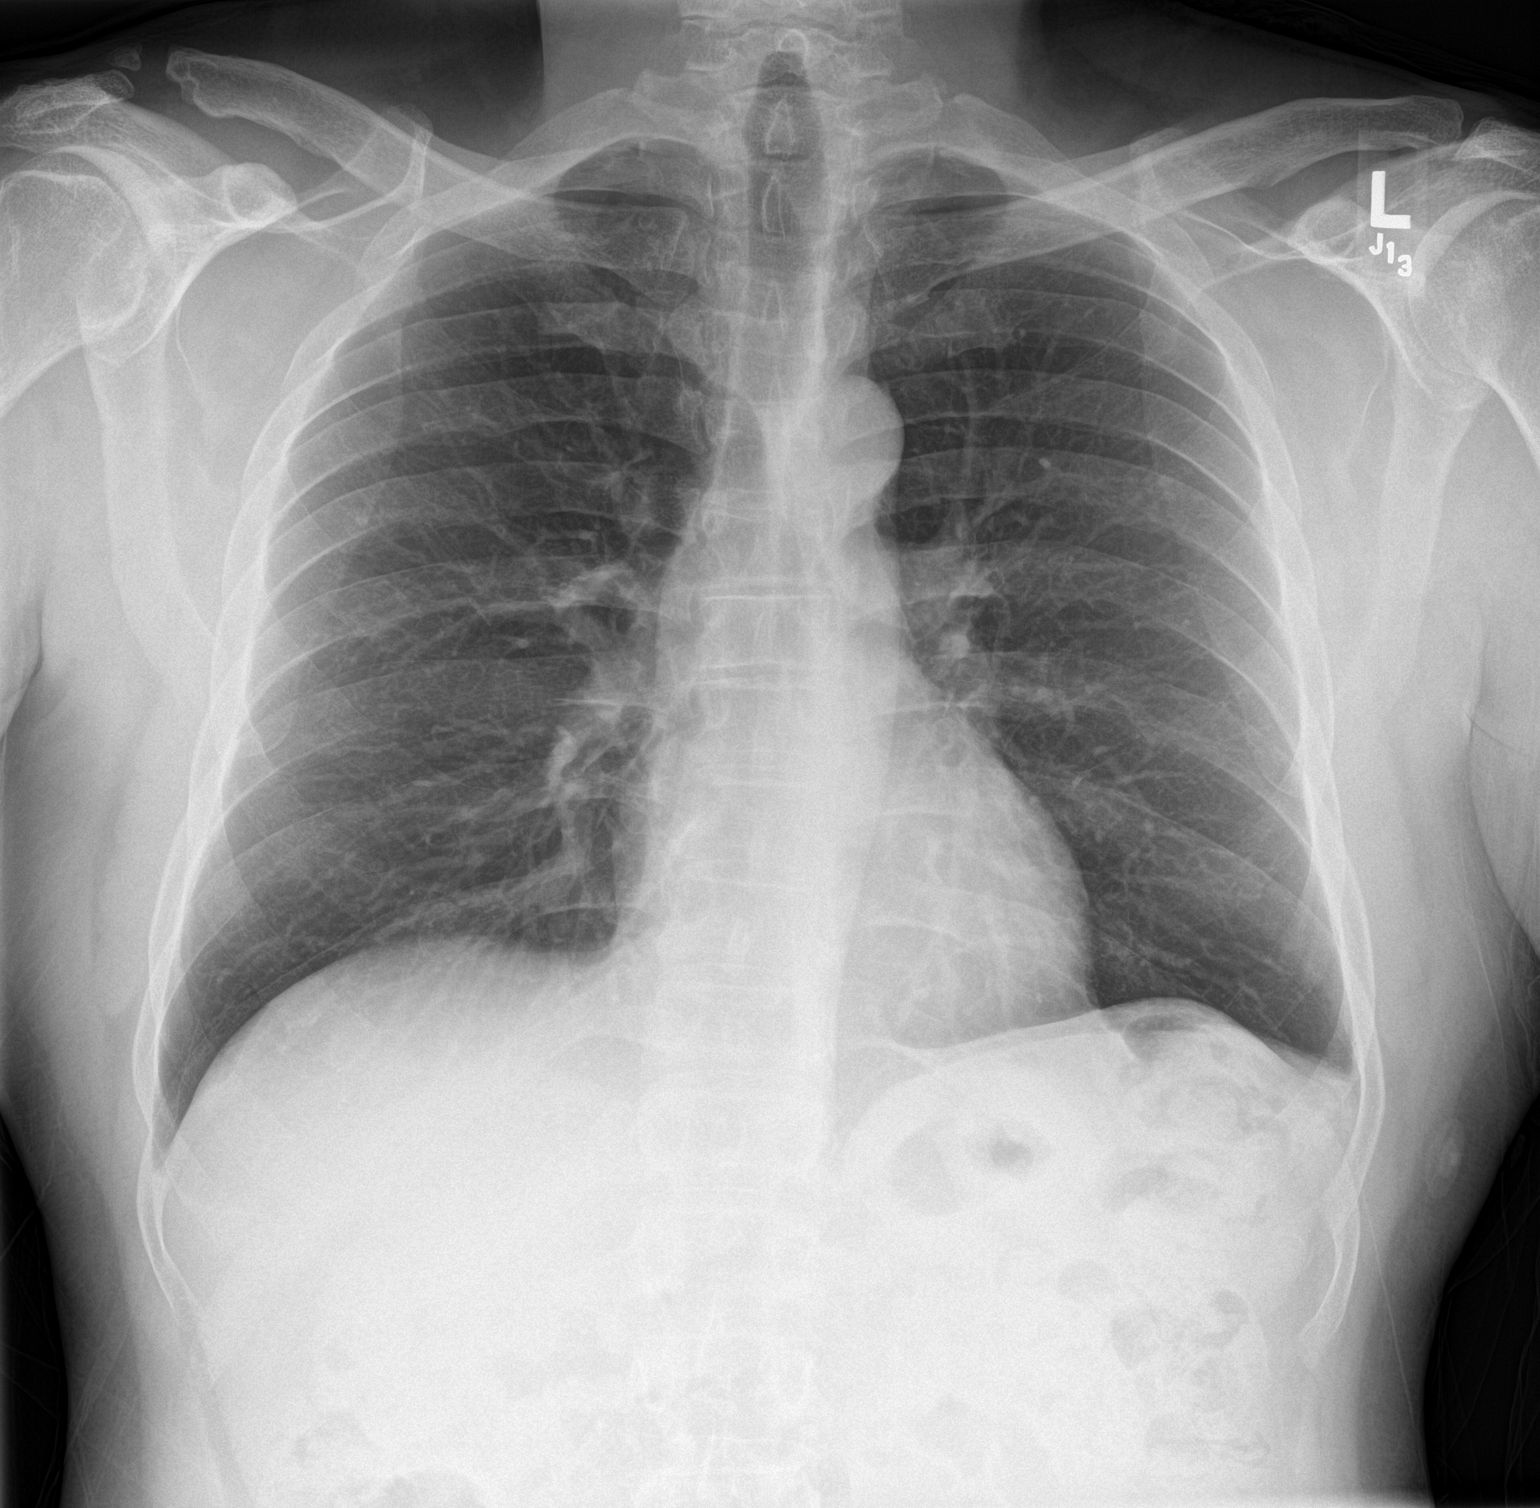

[chest lat]
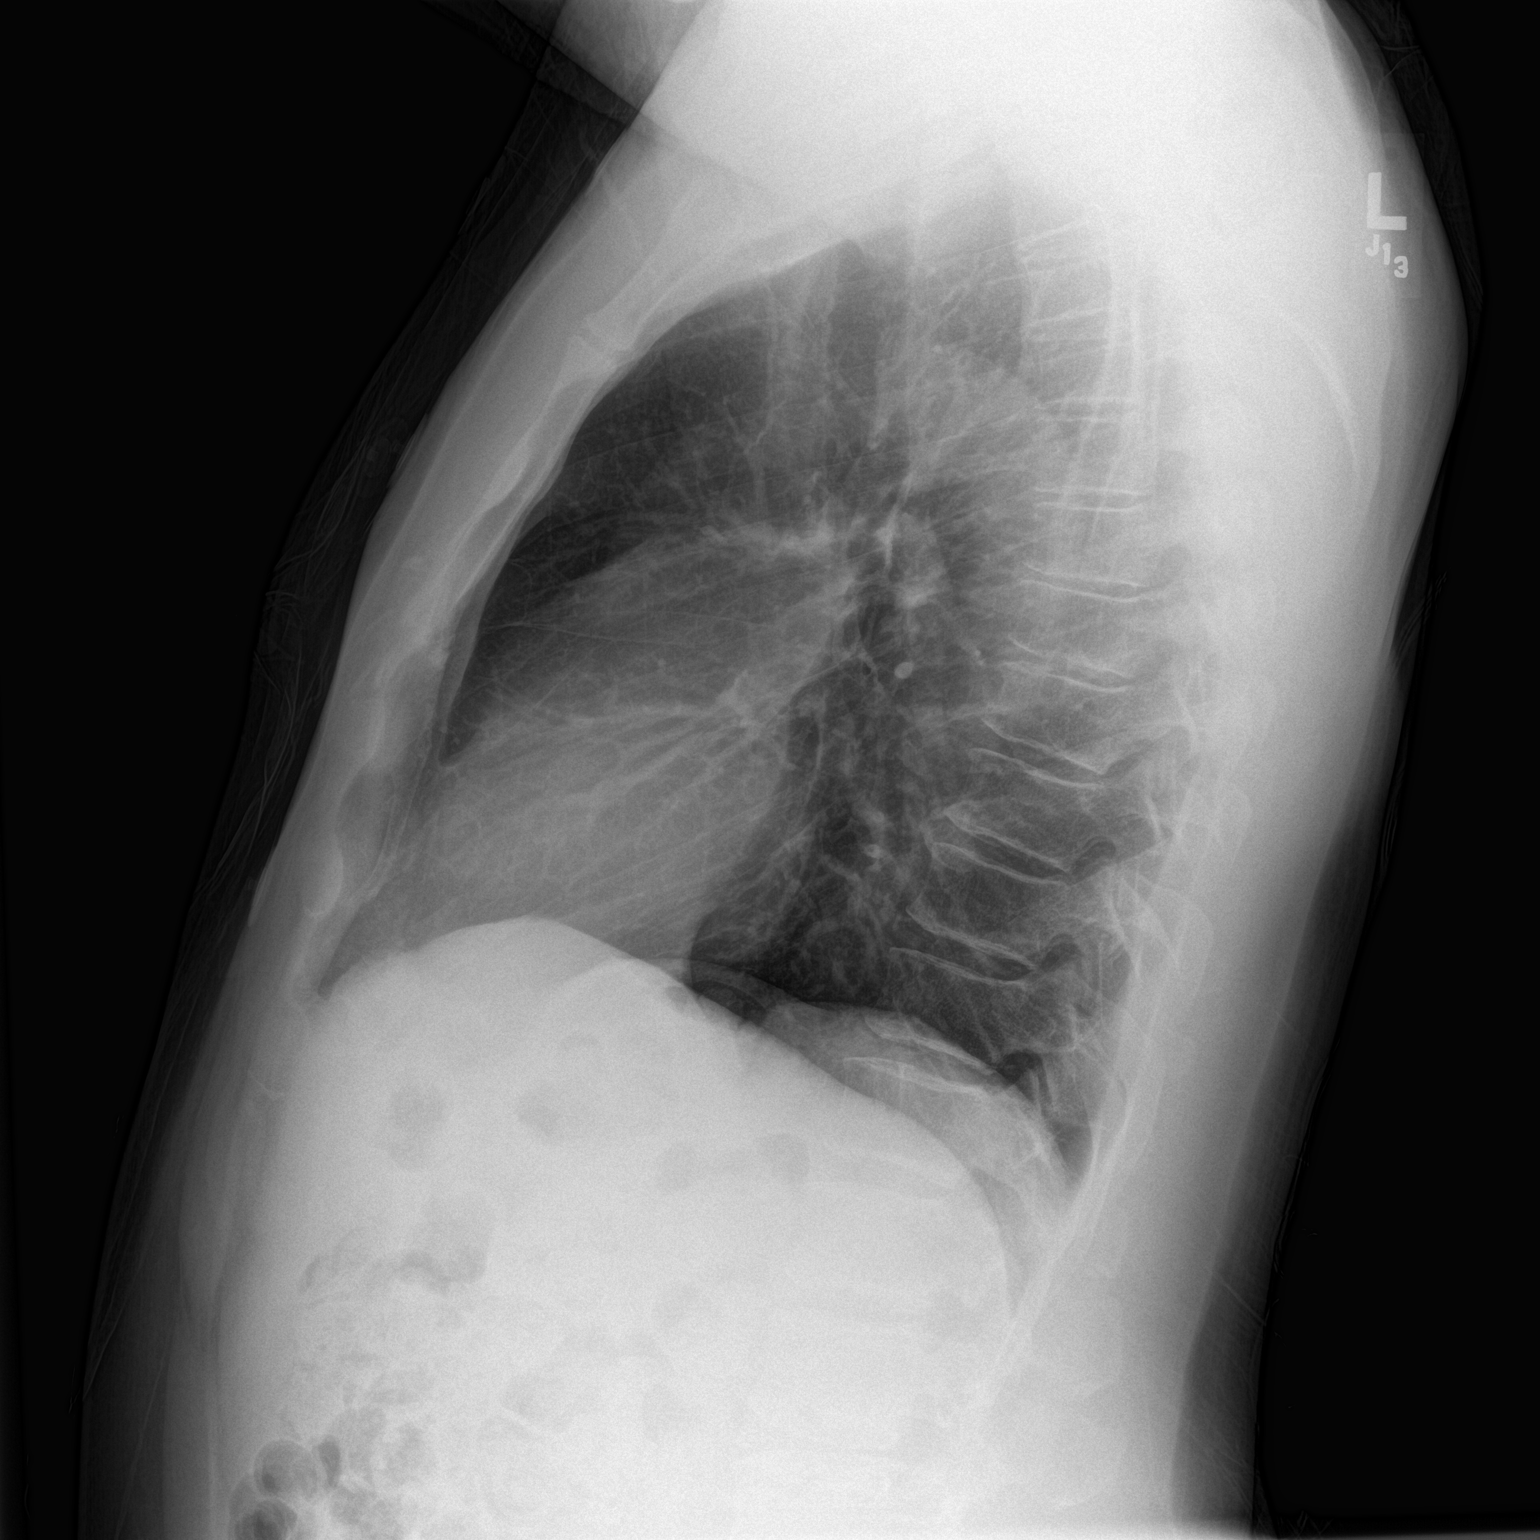

[2 of 2 positions shown; findings below may reference images not displayed]

FINDINGS: The heart size and mediastinal contours are normal. The lungs are
clear. There is mild blunting of the left costophrenic angle on the
frontal examination, but no significant pleural effusion is present
on the lateral view. This likely represents mild scarring. There is
evidence of previous distal right clavicle resection. No acute
osseous findings are demonstrated.
IMPRESSION: No acute cardiopulmonary process.

## 2016-11-21 ENCOUNTER — Encounter: Payer: Self-pay | Admitting: Internal Medicine

## 2016-12-07 ENCOUNTER — Encounter: Payer: Self-pay | Admitting: Internal Medicine

## 2016-12-07 ENCOUNTER — Ambulatory Visit (AMBULATORY_SURGERY_CENTER): Payer: Self-pay

## 2016-12-07 VITALS — Ht 69.25 in | Wt 213.2 lb

## 2016-12-07 DIAGNOSIS — Z1211 Encounter for screening for malignant neoplasm of colon: Secondary | ICD-10-CM

## 2016-12-07 MED ORDER — SUPREP BOWEL PREP KIT 17.5-3.13-1.6 GM/177ML PO SOLN: 1.0000 | Freq: Once | ORAL | 0 refills | Status: AC

## 2016-12-07 NOTE — Progress Notes (Signed)
No allergies to eggs or soy No home oxygen No diet pills No past problems with anesthesia  Registered emmi

## 2016-12-20 ENCOUNTER — Ambulatory Visit (AMBULATORY_SURGERY_CENTER): Payer: BLUE CROSS/BLUE SHIELD | Admitting: Internal Medicine

## 2016-12-20 ENCOUNTER — Encounter: Payer: Self-pay | Admitting: Internal Medicine

## 2016-12-20 VITALS — BP 141/90 | HR 75 | Temp 98.4°F | Resp 16 | Ht 68.0 in | Wt 185.0 lb

## 2016-12-20 DIAGNOSIS — Z1211 Encounter for screening for malignant neoplasm of colon: Secondary | ICD-10-CM | POA: Diagnosis not present

## 2016-12-20 DIAGNOSIS — D122 Benign neoplasm of ascending colon: Secondary | ICD-10-CM

## 2016-12-20 DIAGNOSIS — Z1212 Encounter for screening for malignant neoplasm of rectum: Secondary | ICD-10-CM

## 2016-12-20 MED ORDER — SODIUM CHLORIDE 0.9 % IV SOLN: 500.0000 mL | INTRAVENOUS | Status: DC

## 2016-12-20 NOTE — Progress Notes (Signed)
Pt's states no medical or surgical changes since previsit or office visit. 

## 2016-12-20 NOTE — Progress Notes (Signed)
Called to room to assist during endoscopic procedure.  Patient ID and intended procedure confirmed with present staff. Received instructions for my participation in the procedure from the performing physician.  

## 2016-12-20 NOTE — Patient Instructions (Signed)
YOU HAD AN ENDOSCOPIC PROCEDURE TODAY AT THE [facility]:   Refer to the procedure report that was given to you for any specific questions about what was found during the examination.  If the procedure report does not answer your questions, please call your gastroenterologist to clarify.  If you requested that your care partner not be given the details of your procedure findings, then the procedure report has been included in a sealed envelope for you to review at your convenience later.  YOU SHOULD EXPECT: Some feelings of bloating in the abdomen. Passage of more gas than usual.  Walking can help get rid of the air that was put into your GI tract during the procedure and reduce the bloating. If you had a lower endoscopy (such as a colonoscopy or flexible sigmoidoscopy) you may notice spotting of blood in your stool or on the toilet paper. If you underwent a bowel prep for your procedure, you may not have a normal bowel movement for a few days.  Please Note:  You might notice some irritation and congestion in your nose or some drainage.  This is from the oxygen used during your procedure.  There is no need for concern and it should clear up in a day or so.  SYMPTOMS TO REPORT IMMEDIATELY:   Following lower endoscopy (colonoscopy or flexible sigmoidoscopy):  Excessive amounts of blood in the stool  Significant tenderness or worsening of abdominal pains  Swelling of the abdomen that is new, acute  Fever of 100F or higher   For urgent or emergent issues, a gastroenterologist can be reached at any hour by calling [phone].   DIET:  We do recommend a small meal at first, but then you may proceed to your regular diet.  Drink plenty of fluids but you should avoid alcoholic beverages for 24 hours.  ACTIVITY:  You should plan to take it easy for the rest of today and you should NOT DRIVE or use heavy machinery until tomorrow (because of the sedation medicines used during the test).     FOLLOW UP: Our staff will call the number listed on your records the next business day following your procedure to check on you and address any questions or concerns that you may have regarding the information given to you following your procedure. If we do not reach you, we will leave a message.  However, if you are feeling well and you are not experiencing any problems, there is no need to return our call.  We will assume that you have returned to your regular daily activities without incident.  If any biopsies were taken you will be contacted by phone or by letter within the next 1-3 weeks.  Please call us at [phone] if you have not heard about the biopsies in 3 weeks.    SIGNATURES/CONFIDENTIALITY: You and/or your care partner have signed paperwork which will be entered into your electronic medical record.  These signatures attest to the fact that that the information above on your After Visit Summary has been reviewed and is understood.  Full responsibility of the confidentiality of this discharge information lies with you and/or your care-partner.  Read all handouts given to you by your recovery room nurse. 

## 2016-12-20 NOTE — Progress Notes (Signed)
To PACU, VSS. Report to RN.tb 

## 2016-12-20 NOTE — Op Note (Signed)
[facility] Patient Name: Peter Hunt Procedure Date: 12/20/2016 2:00 PM MRN: [id number] Endoscopist: Jerene Bears , MD Age: 56 Referring MD:  Date of Birth: 09/16/1960 Gender: Male Account #: [id number] Procedure:                Colonoscopy Indications:              Screening for colorectal malignant neoplasm, This                            is the patient's first colonoscopy Medicines:                Monitored Anesthesia Care Procedure:                Pre-Anesthesia Assessment:                           - Prior to the procedure, a History and Physical                            was performed, and patient medications and                            allergies were reviewed. The patient's tolerance of                            previous anesthesia was also reviewed. The risks                            and benefits of the procedure and the sedation                            options and risks were discussed with the patient.                            All questions were answered, and informed consent                            was obtained. Prior Anticoagulants: The patient has                            taken no previous anticoagulant or antiplatelet                            agents. ASA Grade Assessment: II - A patient with                            mild systemic disease. After reviewing the risks                            and benefits, the patient was deemed in                            satisfactory condition to undergo the procedure.  After obtaining informed consent, the colonoscope                            was passed under direct vision. Throughout the                            procedure, the patient's blood pressure, pulse, and                            oxygen saturations were monitored continuously. The                            Colonoscope was introduced through the anus and                            advanced to the the cecum,  identified by                            appendiceal orifice and ileocecal valve. The                            colonoscopy was performed without difficulty. The                            patient tolerated the procedure well. The quality                            of the bowel preparation was good. The ileocecal                            valve, appendiceal orifice, and rectum were                            photographed. Scope In: 2:10:51 PM Scope Out: 2:25:21 PM Scope Withdrawal Time: 0 hours 12 minutes 10 seconds  Total Procedure Duration: 0 hours 14 minutes 30 seconds  Findings:                 The digital rectal exam was normal.                           A 5 mm polyp was found in the ascending colon. The                            polyp was sessile. The polyp was removed with a                            cold snare. Resection and retrieval were complete.                           A few medium-mouthed diverticula were found in the                            sigmoid colon and ascending colon.  Internal hemorrhoids were found during                            retroflexion. The hemorrhoids were small.                           The exam was otherwise without abnormality. Complications:            No immediate complications. Estimated Blood Loss:     Estimated blood loss was minimal. Impression:               - One 5 mm polyp in the ascending colon, removed                            with a cold snare. Resected and retrieved.                           - Mild diverticulosis in the sigmoid colon and in                            the ascending colon.                           - Internal hemorrhoids.                           - The examination was otherwise normal. Recommendation:           - Patient has a contact number available for                            emergencies. The signs and symptoms of potential                            delayed complications were  discussed with the                            patient. Return to normal activities tomorrow.                            Written discharge instructions were provided to the                            patient.                           - Resume previous diet.                           - Continue present medications.                           - Await pathology results.                           - Repeat colonoscopy is recommended. The  colonoscopy date will be determined after pathology                            results from today's exam become available for                            review. Jerene Bears, MD 12/20/2016 2:28:58 PM This report has been signed electronically.

## 2016-12-21 ENCOUNTER — Telehealth: Payer: Self-pay

## 2016-12-21 NOTE — Telephone Encounter (Signed)
  Follow up Call-  Call back number 12/20/2016  Post procedure Call Back phone  # [phone]  Permission to leave phone message Yes  Some recent data might be hidden     Patient questions:  Do you have a fever, pain , or abdominal swelling? No. Pain Score  0 *  Have you tolerated food without any problems? Yes.    Have you been able to return to your normal activities? Yes.    Do you have any questions about your discharge instructions: Diet   No. Medications  No. Follow up visit  No.  Do you have questions or concerns about your Care? No.  Actions: * If pain score is 4 or above: No action needed, pain <4.  No problems noted per pt. maw

## 2016-12-27 ENCOUNTER — Encounter: Payer: Self-pay | Admitting: Internal Medicine

## 2017-05-07 ENCOUNTER — Emergency Department (HOSPITAL_COMMUNITY): Payer: BLUE CROSS/BLUE SHIELD

## 2017-05-07 ENCOUNTER — Encounter (HOSPITAL_COMMUNITY): Payer: Self-pay

## 2017-05-07 ENCOUNTER — Other Ambulatory Visit: Payer: Self-pay

## 2017-05-07 ENCOUNTER — Emergency Department (HOSPITAL_COMMUNITY)
Admission: EM | Admit: 2017-05-07 | Discharge: 2017-05-08 | Disposition: A | Discharge status: Home / Self Care | Payer: BLUE CROSS/BLUE SHIELD | Attending: Emergency Medicine | Admitting: Emergency Medicine

## 2017-05-07 DIAGNOSIS — R42 Dizziness and giddiness: Secondary | ICD-10-CM | POA: Diagnosis not present

## 2017-05-07 DIAGNOSIS — Z7982 Long term (current) use of aspirin: Secondary | ICD-10-CM | POA: Diagnosis not present

## 2017-05-07 DIAGNOSIS — Z87891 Personal history of nicotine dependence: Secondary | ICD-10-CM | POA: Diagnosis not present

## 2017-05-07 DIAGNOSIS — I10 Essential (primary) hypertension: Secondary | ICD-10-CM | POA: Insufficient documentation

## 2017-05-07 DIAGNOSIS — Z79899 Other long term (current) drug therapy: Secondary | ICD-10-CM | POA: Diagnosis not present

## 2017-05-07 LAB — I-STAT TROPONIN, ED: TROPONIN I, POC: 0 ng/mL (ref 0.00–0.08)

## 2017-05-07 LAB — BASIC METABOLIC PANEL
ANION GAP: 10 (ref 5–15)
BUN: 9 mg/dL (ref 6–20)
CALCIUM: 9.5 mg/dL (ref 8.9–10.3)
CO2: 26 mmol/L (ref 22–32)
Chloride: 103 mmol/L (ref 101–111)
Creatinine, Ser: 0.82 mg/dL (ref 0.61–1.24)
Glucose, Bld: 118 mg/dL — ABNORMAL HIGH (ref 65–99)
Potassium: 3.4 mmol/L — ABNORMAL LOW (ref 3.5–5.1)
SODIUM: 139 mmol/L (ref 135–145)

## 2017-05-07 LAB — CBC
HCT: 45.9 % (ref 39.0–52.0)
HEMOGLOBIN: 15 g/dL (ref 13.0–17.0)
MCH: 28.2 pg (ref 26.0–34.0)
MCHC: 32.7 g/dL (ref 30.0–36.0)
MCV: 86.4 fL (ref 78.0–100.0)
Platelets: 211 10*3/uL (ref 150–400)
RBC: 5.31 MIL/uL (ref 4.22–5.81)
RDW: 14.5 % (ref 11.5–15.5)
WBC: 6.1 10*3/uL (ref 4.0–10.5)

## 2017-05-07 MED ORDER — LORAZEPAM 1 MG PO TABS
1.0000 mg | ORAL_TABLET | Freq: Once | ORAL | Status: AC
  Administered 2017-05-07: 1 mg via ORAL
  Filled 2017-05-07: qty 1

## 2017-05-07 MED ORDER — MECLIZINE HCL 25 MG PO TABS
25.0000 mg | ORAL_TABLET | Freq: Once | ORAL | Status: AC
  Administered 2017-05-07: 25 mg via ORAL
  Filled 2017-05-07: qty 1

## 2017-05-07 NOTE — ED Provider Notes (Signed)
Patient placed in Quick Look pathway, seen and evaluated   Chief Complaint: dizziness, abnormal ecg  HPI:   Pt stats woke up this morning with dizziness. Stats worse when standing up. States had sensation of "body floating." reports difficulty walking due to dizziness.  States had to lean or hold on to things when walking. No n/v. No history of the same.  States he is feeling a little better now however still not back to normal.  He states he went to PCP and was told that he is EKG is abnormal and was transferred here.  No history of cardiac problems.   ROS: Positive for dizziness. Negative for cp, sob, n/v.  (one)  Physical Exam:   Gen: No distress  Neuro: Awake and Alert  Skin: Warm    Focused Exam: No acute distress.  Regular heart rate and rhythm.  Lungs are clear bilaterally.  No nystagmus. PERRLA. 5/5 and equal upper and lower extremity strength bilaterally. Equal grip strength bilaterally. Normal finger to nose and heel to shin. No pronator drift. Patellar reflexes 2+   6:34 PM Patient seen medically screened.  Patient with symptoms of vertigo.  Exam unremarkable.  He is mildly hypertensive, otherwise normal vital signs.  Apparently abnormal EKG from primary care doctor's office.   will obtain the EKG here.  Will get basic labs including troponin.  Will try meclizine after a stroke swallow screen.  If no improvement with medications and no explanation for his symptoms from the blood tests obtained, patient may need some imaging of his brain to rule out CVA.  For now appears to be stable.   Vitals:   05/07/17 1814  BP: (!) 157/98  Pulse: 85  Resp: 18  Temp: 98.3 F (36.8 C)  TempSrc: Oral  SpO2: 100%      Initiation of care has begun. The patient has been counseled on the process, plan, and necessity for staying for the completion/evaluation, and the remainder of the medical screening examination     Jeannett Senior, PA-C 05/07/17 1836    Tegeler, Gwenyth Allegra,  MD 05/08/17 [phone]

## 2017-05-07 NOTE — ED Triage Notes (Signed)
Patient complains of positional dizziness since early am. Denies CP, denies SOB. Alert and oriented. States that he was sent from primary MD office because of abnormal EKG.

## 2017-05-08 ENCOUNTER — Emergency Department (HOSPITAL_COMMUNITY): Payer: BLUE CROSS/BLUE SHIELD

## 2017-05-08 MED ORDER — MECLIZINE HCL 25 MG PO TABS: 25.0000 mg | ORAL_TABLET | Freq: Two times a day (BID) | ORAL | 0 refills | Status: AC

## 2017-05-08 NOTE — ED Notes (Signed)
Contacted MRI, pt is next in queue

## 2017-05-08 NOTE — ED Notes (Signed)
Patient transported to MRI 

## 2017-05-08 NOTE — ED Provider Notes (Signed)
[facility] EMERGENCY DEPARTMENT Provider Note   CSN: [id number] Arrival date & time: 05/07/17  1802     History   Chief Complaint Chief Complaint  Patient presents with  . Dizziness    HPI Peter Hunt is a 57 y.o. male.  Patient presents to the ED with a chief complaint of dizziness.  He states that the symptoms started this morning when he woke up.  He states that he has felt off balance.  He denies any vision or speech changes, numbness, weakness, or tingling.  He was given meclizine in triage with minimal relief.  Stroke risk factors are HTN.  Denies fever, chills, CP, or SOB.   The history is provided by the patient. No language interpreter was used.    Past Medical History:  Diagnosis Date  . At risk for sleep apnea    STOP-BANG= 4     SENT TO PCP 03-04-2014  . Hypertension   . Infection of index finger    left--  s/p  I&D and Tendon Repair and closure laceration  . Wears glasses     Patient Active Problem List   Diagnosis Date Noted  . HEMATOCHEZIA 08/08/2007  . HYPERTENSION 02/18/2007  . PEPTIC ULCER DISEASE 02/18/2007    Past Surgical History:  Procedure Laterality Date  . CALCANEAL OSTEOTOMY WITH ILIAC CREST BONE GRAFT AND REPAIR SUBLEXING TENDON AND ACHILLES TENDON Right 11-04-2002  . ELBOW SURGERY Right 1996  . I&D EXTREMITY Left 02/03/2014   Procedure: IRRIGATION AND DEBRIDEMENT LEFT INDEX FINGER,  REPAIR TENDON LEFT INDEX FINGER;  Surgeon: Linna Hoff, MD;  Location: [facility];  Service: Orthopedics;  Laterality: Left;  . INCISION AND DRAINAGE OF WOUND Left 03/04/2014   Procedure: IRRIGATION AND DEBRIDEMENT LEFT INDEX FINGER WOUND;  Surgeon: Linna Hoff, MD;  Location: [facility];  Service: Orthopedics;  Laterality: Left;  . PERCUTANEOUS PINNING Left 02/03/2014   Procedure: PERCUTANEOUS PINNING LEFT INDEX FINGER;  Surgeon: Linna Hoff, MD;  Location: [facility];  Service: Orthopedics;  Laterality: Left;  . SHOULDER  ARTHROSCOPY Bilateral right x2/   left x1--  1990's       Home Medications    Prior to Admission medications   Medication Sig Start Date End Date Taking? Authorizing Provider  acetaminophen (TYLENOL) 500 MG tablet Take 1,000 mg by mouth every 6 (six) hours as needed for headache (pain).   Yes [provider]  amLODipine (NORVASC) 10 MG tablet Take 10 mg by mouth daily. 04/11/17  Yes [provider]  Aromatic Inhalants (VICKS VAPOR INHALER IN) Place 1 spray into both nostrils daily as needed (congestion).   Yes [provider]  aspirin EC 81 MG tablet Take 81 mg by mouth daily.   Yes [provider]  hydrochlorothiazide (HYDRODIURIL) 25 MG tablet Take 25 mg by mouth daily. 04/11/17  Yes [provider]  ketoconazole (NIZORAL) 2 % shampoo Apply 1 application topically 2 (two) times a week.  12/05/16  Yes [provider]  meclizine (ANTIVERT) 25 MG tablet Take 1 tablet (25 mg total) by mouth 2 (two) times daily. 05/08/17   Montine Circle, PA-C    Family History Family History  Problem Relation Age of Onset  . Colon cancer Neg Hx   . Esophageal cancer Neg Hx   . Stomach cancer Neg Hx   . Pancreatic cancer Neg Hx     Social History Social History   Tobacco Use  . Smoking status: Former Smoker  Packs/day: 1.00    Years: 33.00    Pack years: 33.00    Types: Cigarettes    Last attempt to quit: 03/04/1994    Years since quitting: 23.1  . Smokeless tobacco: Never Used  Substance Use Topics  . Alcohol use: No  . Drug use: No     Allergies   Ace inhibitors   Review of Systems Review of Systems  All other systems reviewed and are negative.    Physical Exam Updated Vital Signs BP (!) 157/98   Pulse 85   Temp 98.3 F (36.8 C)   Resp 18   SpO2 100%   Physical Exam  Constitutional: He is oriented to person, place, and time. He appears well-developed and well-nourished.  HENT:  Head: Normocephalic and atraumatic.    Right Ear: External ear normal.  Left Ear: External ear normal.  Eyes: Conjunctivae and EOM are normal. Pupils are equal, round, and reactive to light.  Neck: Normal range of motion. Neck supple.  No pain with neck flexion, no meningismus  Cardiovascular: Normal rate, regular rhythm and normal heart sounds. Exam reveals no gallop and no friction rub.  No murmur heard. Pulmonary/Chest: Effort normal and breath sounds normal. No respiratory distress. He has no wheezes. He has no rales. He exhibits no tenderness.  Abdominal: Soft. He exhibits no distension and no mass. There is no tenderness. There is no rebound and no guarding.  Musculoskeletal: Normal range of motion. He exhibits no edema or tenderness.  Normal gait.  Neurological: He is alert and oriented to person, place, and time. He has normal reflexes.  CN 3-12 intact, normal finger to nose, no pronator drift, sensation and strength intact bilaterally.  Skin: Skin is warm and dry.  Psychiatric: He has a normal mood and affect. His behavior is normal. Judgment and thought content normal.  Nursing note and vitals reviewed.    ED Treatments / Results  Labs (all labs ordered are listed, but only abnormal results are displayed) Labs Reviewed  BASIC METABOLIC PANEL - Abnormal; Notable for the following components:      Result Value   Potassium 3.4 (*)    Glucose, Bld 118 (*)    All other components within normal limits  CBC  I-STAT TROPONIN, ED    EKG  EKG Interpretation  Date/Time:  Tuesday May 07 2017 22:55:59 EDT Ventricular Rate:  68 PR Interval:    QRS Duration: 96 QT Interval:  401 QTC Calculation: 427 R Axis:   -10 Text Interpretation:  Sinus rhythm Left ventricular hypertrophy No significant change since last tracing Confirmed by Ripley Fraise [phone]) on 05/08/2017 12:37:34 AM       Radiology Dg Chest 2 View  Result Date: 05/07/2017 CLINICAL DATA:  Dizziness EXAM: CHEST - 2 VIEW COMPARISON:  02/03/2014  FINDINGS: The heart size and mediastinal contours are within normal limits. Both lungs are clear. The visualized skeletal structures are unremarkable. IMPRESSION: No active cardiopulmonary disease. Electronically Signed   By: Franchot Gallo M.D.   On: 05/07/2017 20:05   Mr Brain Wo Contrast  Result Date: 05/08/2017 CLINICAL DATA:  Central vertigo EXAM: MRI HEAD WITHOUT CONTRAST TECHNIQUE: Multiplanar, multiecho pulse sequences of the brain and surrounding structures were obtained without intravenous contrast. COMPARISON:  None. FINDINGS: Brain: The midline structures are normal. There is no acute infarct or acute hemorrhage. No mass lesion, hydrocephalus, dural abnormality or extra-axial collection. Multifocal white matter hyperintensity, most commonly due to chronic ischemic microangiopathy. No age-advanced or lobar predominant atrophy.  No chronic microhemorrhage or superficial siderosis. Vascular: Major intracranial arterial and venous sinus flow voids are preserved. Skull and upper cervical spine: The visualized skull base, calvarium, upper cervical spine and extracranial soft tissues are normal. Sinuses/Orbits: No fluid levels or advanced mucosal thickening. No mastoid or middle ear effusion. Normal orbits. IMPRESSION: No acute abnormality.  Mild chronic small vessel disease. Electronically Signed   By: Ulyses Jarred M.D.   On: 05/08/2017 01:32    Procedures Procedures (including critical care time)  Medications Ordered in ED Medications  meclizine (ANTIVERT) tablet 25 mg (25 mg Oral Given 05/07/17 1840)  LORazepam (ATIVAN) tablet 1 mg (1 mg Oral Given 05/07/17 2247)     Initial Impression / Assessment and Plan / ED Course  I have reviewed the triage vital signs and the nursing notes.  Pertinent labs & imaging results that were available during my care of the patient were reviewed by me and considered in my medical decision making (see chart for details).     Patient with dizziness.   Worsened with head movement.  No nystagmus.  Gait is normal.  No strength deficits.  Neurovascularly intact.  No significant improvement of reported symptoms with ativan or meclizine, but no objective findings.  Will get MRI to rule out cerebellar stroke.  MRI is negative for acute process.  DC to home with PCP follow-up and meclizine.  Final Clinical Impressions(s) / ED Diagnoses   Final diagnoses:  Vertigo    ED Discharge Orders        Ordered    meclizine (ANTIVERT) 25 MG tablet  2 times daily     05/08/17 0151       Montine Circle, PA-C 05/08/17 0155    Jola Schmidt, MD 05/10/17 [phone]

## 2019-02-16 ENCOUNTER — Other Ambulatory Visit: Payer: Self-pay | Admitting: Orthopedic Surgery

## 2019-02-16 DIAGNOSIS — M47816 Spondylosis without myelopathy or radiculopathy, lumbar region: Secondary | ICD-10-CM

## 2019-03-03 ENCOUNTER — Ambulatory Visit
Admission: RE | Admit: 2019-03-03 | Discharge: 2019-03-03 | Disposition: A | Discharge status: Home / Self Care | Payer: BC Managed Care – PPO | Source: Ambulatory Visit | Attending: Orthopedic Surgery | Admitting: Orthopedic Surgery

## 2019-03-03 DIAGNOSIS — M47816 Spondylosis without myelopathy or radiculopathy, lumbar region: Secondary | ICD-10-CM

## 2019-11-03 ENCOUNTER — Other Ambulatory Visit: Payer: Self-pay | Admitting: Orthopedic Surgery

## 2019-11-03 DIAGNOSIS — M545 Low back pain, unspecified: Secondary | ICD-10-CM

## 2019-11-05 ENCOUNTER — Other Ambulatory Visit: Payer: Self-pay | Admitting: Orthopedic Surgery

## 2019-11-09 ENCOUNTER — Other Ambulatory Visit: Payer: Self-pay

## 2019-11-09 ENCOUNTER — Ambulatory Visit
Admission: RE | Admit: 2019-11-09 | Discharge: 2019-11-09 | Disposition: A | Discharge status: Home / Self Care | Payer: BC Managed Care – PPO | Source: Ambulatory Visit | Attending: Orthopedic Surgery | Admitting: Orthopedic Surgery

## 2019-11-09 DIAGNOSIS — M545 Low back pain, unspecified: Secondary | ICD-10-CM

## 2019-11-09 MED ORDER — IOPAMIDOL (ISOVUE-300) INJECTION 61%
100.0000 mL | Freq: Once | INTRAVENOUS | Status: AC | PRN
  Administered 2019-11-09: 13:00:00 100 mL via INTRAVENOUS

## 2019-12-01 ENCOUNTER — Other Ambulatory Visit (HOSPITAL_COMMUNITY): Payer: Self-pay | Admitting: Orthopedic Surgery

## 2019-12-01 ENCOUNTER — Other Ambulatory Visit: Payer: Self-pay | Admitting: Orthopedic Surgery

## 2019-12-01 DIAGNOSIS — M545 Low back pain, unspecified: Secondary | ICD-10-CM

## 2019-12-15 ENCOUNTER — Ambulatory Visit (HOSPITAL_COMMUNITY)
Admission: RE | Admit: 2019-12-15 | Discharge: 2019-12-15 | Disposition: A | Discharge status: Home / Self Care | Payer: BC Managed Care – PPO | Source: Ambulatory Visit | Attending: Orthopedic Surgery | Admitting: Orthopedic Surgery

## 2019-12-15 ENCOUNTER — Other Ambulatory Visit: Payer: Self-pay

## 2019-12-15 DIAGNOSIS — M545 Low back pain, unspecified: Secondary | ICD-10-CM | POA: Diagnosis not present

## 2021-11-03 ENCOUNTER — Encounter: Payer: Self-pay | Admitting: Internal Medicine

## 2021-12-01 ENCOUNTER — Ambulatory Visit (AMBULATORY_SURGERY_CENTER): Payer: Self-pay

## 2021-12-01 VITALS — Ht 68.0 in | Wt 205.0 lb

## 2021-12-01 DIAGNOSIS — Z86010 Personal history of colonic polyps: Secondary | ICD-10-CM

## 2021-12-01 MED ORDER — NA SULFATE-K SULFATE-MG SULF 17.5-3.13-1.6 GM/177ML PO SOLN: 1.0000 | ORAL | 0 refills | Status: DC

## 2021-12-01 NOTE — Progress Notes (Signed)
No egg or soy allergy known to patient  No issues known to pt with past sedation with any surgeries or procedures Patient denies ever being told they had issues or difficulty with intubation  No FH of Malignant Hyperthermia Pt is not on diet pills Pt is not on  home 02  Pt is not on blood thinners  Pt denies issues with constipation  No A fib or A flutter Have any cardiac testing pending--denied Pt instructed to use Single[employer] or GoodRx for a price reduction on prep   

## 2021-12-14 ENCOUNTER — Encounter: Payer: Self-pay | Admitting: Internal Medicine

## 2021-12-22 ENCOUNTER — Encounter: Payer: Self-pay | Admitting: Internal Medicine

## 2021-12-22 ENCOUNTER — Ambulatory Visit (AMBULATORY_SURGERY_CENTER): Payer: BC Managed Care – PPO | Admitting: Internal Medicine

## 2021-12-22 VITALS — BP 131/78 | HR 74 | Temp 97.3°F | Resp 13 | Ht 68.0 in | Wt 205.0 lb

## 2021-12-22 DIAGNOSIS — D125 Benign neoplasm of sigmoid colon: Secondary | ICD-10-CM

## 2021-12-22 DIAGNOSIS — Z86010 Personal history of colonic polyps: Secondary | ICD-10-CM | POA: Diagnosis not present

## 2021-12-22 DIAGNOSIS — Z09 Encounter for follow-up examination after completed treatment for conditions other than malignant neoplasm: Secondary | ICD-10-CM

## 2021-12-22 MED ORDER — SODIUM CHLORIDE 0.9 % IV SOLN: 500.0000 mL | INTRAVENOUS | Status: DC

## 2021-12-22 NOTE — Progress Notes (Signed)
Report to PACU, RN, vss, BBS= Clear.  

## 2021-12-22 NOTE — Progress Notes (Signed)
GASTROENTEROLOGY PROCEDURE H&P NOTE   Primary Care Physician: Boyce Medici, FNP    Reason for Procedure:  History of colonic adenoma  Plan:    Surveillance colonoscopy  Patient is appropriate for endoscopic procedure(s) in the ambulatory ([facility]) setting.  The nature of the procedure, as well as the risks, benefits, and alternatives were carefully and thoroughly reviewed with the patient. Ample time for discussion and questions allowed. The patient understood, was satisfied, and agreed to proceed.     HPI: Peter Hunt is a 61 y.o. male who presents for colonoscopy.  Medical history as below.  Tolerated the prep.  No recent chest pain or shortness of breath.  No abdominal pain today.  Past Medical History:  Diagnosis Date   At risk for sleep apnea    STOP-BANG= 4     SENT TO PCP 03-04-2014   Hypertension    Infection of index finger    left--  s/p  I&D and Tendon Repair and closure laceration   Wears glasses     Past Surgical History:  Procedure Laterality Date   CALCANEAL OSTEOTOMY WITH ILIAC CREST BONE GRAFT AND REPAIR SUBLEXING TENDON AND ACHILLES TENDON Right 11-04-2002   ELBOW SURGERY Right 1996   I & D EXTREMITY Left 02/03/2014   Procedure: IRRIGATION AND DEBRIDEMENT LEFT INDEX FINGER,  REPAIR TENDON LEFT INDEX FINGER;  Surgeon: Linna Hoff, MD;  Location: [facility];  Service: Orthopedics;  Laterality: Left;   INCISION AND DRAINAGE OF WOUND Left 03/04/2014   Procedure: IRRIGATION AND DEBRIDEMENT LEFT INDEX FINGER WOUND;  Surgeon: Linna Hoff, MD;  Location: [facility];  Service: Orthopedics;  Laterality: Left;   PERCUTANEOUS PINNING Left 02/03/2014   Procedure: PERCUTANEOUS PINNING LEFT INDEX FINGER;  Surgeon: Linna Hoff, MD;  Location: [facility];  Service: Orthopedics;  Laterality: Left;   SHOULDER ARTHROSCOPY Bilateral right x2/   left x1--  1990's    Prior to Admission medications   Medication Sig Start Date End Date Taking? Authorizing Provider   amLODipine (NORVASC) 10 MG tablet Take 10 mg by mouth daily. 04/11/17  Yes [provider]  hydrochlorothiazide (HYDRODIURIL) 25 MG tablet Take 25 mg by mouth daily. 04/11/17  Yes [provider]  acetaminophen (TYLENOL) 500 MG tablet Take 1,000 mg by mouth every 6 (six) hours as needed for headache (pain). Patient not taking: Reported on 12/01/2021    [provider]  Aromatic Inhalants (VICKS VAPOR INHALER IN) Place 1 spray into both nostrils daily as needed (congestion). Patient not taking: Reported on 12/01/2021    [provider]  aspirin EC 81 MG tablet Take 81 mg by mouth daily. Patient not taking: Reported on 12/01/2021    [provider]  celecoxib (CELEBREX) 200 MG capsule Take 200 mg by mouth daily. 10/11/21   [provider]  ketoconazole (NIZORAL) 2 % shampoo Apply 1 application topically 2 (two) times a week.  Patient not taking: Reported on 12/01/2021 12/05/16   [provider]  meclizine (ANTIVERT) 25 MG tablet Take 1 tablet (25 mg total) by mouth 2 (two) times daily. Patient not taking: Reported on 12/01/2021 05/08/17   Montine Circle, PA-C    Current Outpatient Medications  Medication Sig Dispense Refill   amLODipine (NORVASC) 10 MG tablet Take 10 mg by mouth daily.  0   hydrochlorothiazide (HYDRODIURIL) 25 MG tablet Take 25 mg by mouth daily.  0   acetaminophen (TYLENOL) 500 MG tablet Take 1,000 mg by mouth every  6 (six) hours as needed for headache (pain). (Patient not taking: Reported on 12/01/2021)     Aromatic Inhalants (VICKS VAPOR INHALER IN) Place 1 spray into both nostrils daily as needed (congestion). (Patient not taking: Reported on 12/01/2021)     aspirin EC 81 MG tablet Take 81 mg by mouth daily. (Patient not taking: Reported on 12/01/2021)     celecoxib (CELEBREX) 200 MG capsule Take 200 mg by mouth daily.     ketoconazole (NIZORAL) 2 % shampoo Apply 1 application topically 2 (two) times a week.  (Patient  not taking: Reported on 12/01/2021)  3   meclizine (ANTIVERT) 25 MG tablet Take 1 tablet (25 mg total) by mouth 2 (two) times daily. (Patient not taking: Reported on 12/01/2021) 14 tablet 0   Current Facility-Administered Medications  Medication Dose Route Frequency Provider Last Rate Last Admin   0.9 %  sodium chloride infusion  500 mL Intravenous Continuous Estie Sproule, Lajuan Lines, MD        Allergies as of 12/22/2021 - Review Complete 12/22/2021  Allergen Reaction Noted   Ace inhibitors Other (See Comments) 03/26/2008    Family History  Problem Relation Age of Onset   Colon cancer Neg Hx    Esophageal cancer Neg Hx    Stomach cancer Neg Hx    Pancreatic cancer Neg Hx    Rectal cancer Neg Hx     Social History   Socioeconomic History   Marital status: Married    Spouse name: Not on file   Number of children: Not on file   Years of education: Not on file   Highest education level: Not on file  Occupational History   Not on file  Tobacco Use   Smoking status: Former    Packs/day: 1.00    Years: 33.00    Total pack years: 33.00    Types: Cigarettes    Quit date: 03/04/1994    Years since quitting: 27.8   Smokeless tobacco: Never  Substance and Sexual Activity   Alcohol use: No   Drug use: No   Sexual activity: Not on file  Other Topics Concern   Not on file  Social History Narrative   Not on file   Social Determinants of Health   Financial Resource Strain: Not on file  Food Insecurity: Not on file  Transportation Needs: Not on file  Physical Activity: Not on file  Stress: Not on file  Social Connections: Not on file  Intimate Partner Violence: Not on file    Physical Exam: Vital signs in last 24 hours: '@BP'$  (!) 145/93   Pulse 90   Temp (!) 97.3 F (36.3 C)   Ht '5\' 8"'$  (1.727 m)   Wt 205 lb (93 kg)   BMI 31.17 kg/m  GEN: NAD EYE: Sclerae anicteric ENT: MMM CV: Non-tachycardic Pulm: CTA b/l GI: Soft, NT/ND NEURO:  Alert & Oriented x 3   Zenovia Jarred,  MD [facility]  12/22/2021 10:35 AM

## 2021-12-22 NOTE — Op Note (Signed)
[facility] Patient Name: Peter Hunt Procedure Date: 12/22/2021 10:33 AM MRN: [id number] Endoscopist: Jerene Bears , MD, [id number] Age: 61 Referring MD:  Date of Birth: 1960-05-29 Gender: Male Account #: [id number] Procedure:                Colonoscopy Indications:              High risk colon cancer surveillance: Personal                            history of non-advanced adenoma, Last colonoscopy:                            October 2018 Medicines:                Monitored Anesthesia Care Procedure:                Pre-Anesthesia Assessment:                           - Prior to the procedure, a History and Physical                            was performed, and patient medications and                            allergies were reviewed. The patient's tolerance of                            previous anesthesia was also reviewed. The risks                            and benefits of the procedure and the sedation                            options and risks were discussed with the patient.                            All questions were answered, and informed consent                            was obtained. Prior Anticoagulants: The patient has                            taken no anticoagulant or antiplatelet agents. ASA                            Grade Assessment: II - A patient with mild systemic                            disease. After reviewing the risks and benefits,                            the patient was deemed in satisfactory condition to  undergo the procedure.                           After obtaining informed consent, the colonoscope                            was passed under direct vision. Throughout the                            procedure, the patient's blood pressure, pulse, and                            oxygen saturations were monitored continuously. The                            CF [id number] #[id number] was introduced through the anus                             and advanced to the cecum, identified by                            transillumination. The colonoscopy was performed                            without difficulty. The patient tolerated the                            procedure well. The quality of the bowel                            preparation was good. The ileocecal valve,                            appendiceal orifice, and rectum were photographed. Scope In: 10:45:55 AM Scope Out: 11:00:51 AM Scope Withdrawal Time: 0 hours 11 minutes 33 seconds  Total Procedure Duration: 0 hours 14 minutes 56 seconds  Findings:                 The digital rectal exam was normal.                           A 6 mm polyp was found in the sigmoid colon. The                            polyp was sessile. The polyp was removed with a                            cold snare. Resection and retrieval were complete.                           Multiple small-mouthed diverticula were found in                            the sigmoid colon, hepatic flexure and ascending  colon.                           Internal hemorrhoids were found during                            retroflexion. The hemorrhoids were small. Complications:            No immediate complications. Estimated Blood Loss:     Estimated blood loss was minimal. Impression:               - One 6 mm polyp in the sigmoid colon, removed with                            a cold snare. Resected and retrieved.                           - Mild diverticulosis in the sigmoid colon, at the                            hepatic flexure and in the ascending colon.                           - Small internal hemorrhoids. Recommendation:           - Patient has a contact number available for                            emergencies. The signs and symptoms of potential                            delayed complications were discussed with the                            patient.  Return to normal activities tomorrow.                            Written discharge instructions were provided to the                            patient.                           - Resume previous diet.                           - Continue present medications.                           - Await pathology results.                           - Repeat colonoscopy is recommended for                            surveillance. The colonoscopy date will be  determined after pathology results from today's                            exam become available for review. Jerene Bears, MD 12/22/2021 11:03:41 AM This report has been signed electronically.

## 2021-12-22 NOTE — Progress Notes (Signed)
Pt's states no medical or surgical changes since previsit or office visit. 

## 2021-12-22 NOTE — Progress Notes (Signed)
Called to room to assist during endoscopic procedure.  Patient ID and intended procedure confirmed with present staff. Received instructions for my participation in the procedure from the performing physician.  

## 2021-12-22 NOTE — Patient Instructions (Signed)
Handouts on polyps and diverticulosis given to you today  Await pathology results   YOU HAD AN ENDOSCOPIC PROCEDURE TODAY AT [facility]:   Refer to the procedure report that was given to you for any specific questions about what was found during the examination.  If the procedure report does not answer your questions, please call your gastroenterologist to clarify.  If you requested that your care partner not be given the details of your procedure findings, then the procedure report has been included in a sealed envelope for you to review at your convenience later.  YOU SHOULD EXPECT: Some feelings of bloating in the abdomen. Passage of more gas than usual.  Walking can help get rid of the air that was put into your GI tract during the procedure and reduce the bloating. If you had a lower endoscopy (such as a colonoscopy or flexible sigmoidoscopy) you may notice spotting of blood in your stool or on the toilet paper. If you underwent a bowel prep for your procedure, you may not have a normal bowel movement for a few days.  Please Note:  You might notice some irritation and congestion in your nose or some drainage.  This is from the oxygen used during your procedure.  There is no need for concern and it should clear up in a day or so.  SYMPTOMS TO REPORT IMMEDIATELY:  Following lower endoscopy (colonoscopy or flexible sigmoidoscopy):  Excessive amounts of blood in the stool  Significant tenderness or worsening of abdominal pains  Swelling of the abdomen that is new, acute  Fever of 100F or higher  For urgent or emergent issues, a gastroenterologist can be reached at any hour by calling [phone]. Do not use MyChart messaging for urgent concerns.    DIET:  We do recommend a small meal at first, but then you may proceed to your regular diet.  Drink plenty of fluids but you should avoid alcoholic beverages for 24 hours.  ACTIVITY:  You should plan to take it easy for the  rest of today and you should NOT DRIVE or use heavy machinery until tomorrow (because of the sedation medicines used during the test).    FOLLOW UP: Our staff will call the number listed on your records the next business day following your procedure.  We will call around 7:15- 8:00 am to check on you and address any questions or concerns that you may have regarding the information given to you following your procedure. If we do not reach you, we will leave a message.     If any biopsies were taken you will be contacted by phone or by letter within the next 1-3 weeks.  Please call us at [phone] if you have not heard about the biopsies in 3 weeks.    SIGNATURES/CONFIDENTIALITY: You and/or your care partner have signed paperwork which will be entered into your electronic medical record.  These signatures attest to the fact that that the information above on your After Visit Summary has been reviewed and is understood.  Full responsibility of the confidentiality of this discharge information lies with you and/or your care-partner.

## 2021-12-25 ENCOUNTER — Telehealth: Payer: Self-pay

## 2021-12-25 NOTE — Telephone Encounter (Signed)
Left message on follow up call. 

## 2021-12-27 ENCOUNTER — Encounter: Payer: Self-pay | Admitting: Internal Medicine

## 2023-01-04 ENCOUNTER — Encounter: Payer: Self-pay | Admitting: *Deleted

## 2023-01-04 ENCOUNTER — Other Ambulatory Visit: Payer: Self-pay

## 2023-01-04 ENCOUNTER — Ambulatory Visit
Admission: EM | Admit: 2023-01-04 | Discharge: 2023-01-04 | Disposition: A | Discharge status: Home / Self Care | Payer: BC Managed Care – PPO | Attending: Internal Medicine | Admitting: Internal Medicine

## 2023-01-04 DIAGNOSIS — H6122 Impacted cerumen, left ear: Secondary | ICD-10-CM | POA: Diagnosis not present

## 2023-01-04 MED ORDER — CARBAMIDE PEROXIDE 6.5 % OT SOLN
5.0000 [drp] | Freq: Once | OTIC | Status: AC
  Administered 2023-01-04: 5 [drp] via OTIC

## 2023-01-04 NOTE — ED Triage Notes (Signed)
Left ear fullness x 1 week. Wears ear plugs and work and thinks he may have pushed wax into the canal

## 2023-01-04 NOTE — ED Provider Notes (Signed)
[facility]    CSN: [id number] Arrival date & time: 01/04/23  1618      History   Chief Complaint Chief Complaint  Patient presents with   Ear Fullness    HPI Peter Hunt is a 62 y.o. male.   Patient presents with left ear fullness that started about a week ago.  Denies pain to the ear.  Denies trauma or foreign body.  Reports that he wears earplugs at work and thinks that he may have pushed wax into the canal.   Ear Fullness    Past Medical History:  Diagnosis Date   At risk for sleep apnea    STOP-BANG= 4     SENT TO PCP 03-04-2014   Hypertension    Infection of index finger    left--  s/p  I&D and Tendon Repair and closure laceration   Wears glasses     Patient Active Problem List   Diagnosis Date Noted   HEMATOCHEZIA 08/08/2007   Essential hypertension 02/18/2007   Peptic ulcer 02/18/2007    Past Surgical History:  Procedure Laterality Date   CALCANEAL OSTEOTOMY WITH ILIAC CREST BONE GRAFT AND REPAIR SUBLEXING TENDON AND ACHILLES TENDON Right 11-04-2002   ELBOW SURGERY Right 1996   I & D EXTREMITY Left 02/03/2014   Procedure: IRRIGATION AND DEBRIDEMENT LEFT INDEX FINGER,  REPAIR TENDON LEFT INDEX FINGER;  Surgeon: Sharma Covert, MD;  Location: [facility] OR;  Service: Orthopedics;  Laterality: Left;   INCISION AND DRAINAGE OF WOUND Left 03/04/2014   Procedure: IRRIGATION AND DEBRIDEMENT LEFT INDEX FINGER WOUND;  Surgeon: Sharma Covert, MD;  Location: [facility];  Service: Orthopedics;  Laterality: Left;   PERCUTANEOUS PINNING Left 02/03/2014   Procedure: PERCUTANEOUS PINNING LEFT INDEX FINGER;  Surgeon: Sharma Covert, MD;  Location: [facility] OR;  Service: Orthopedics;  Laterality: Left;   SHOULDER ARTHROSCOPY Bilateral right x2/   left x1--  1990's       Home Medications    Prior to Admission medications   Medication Sig Start Date End Date Taking? Authorizing Provider  amLODipine (NORVASC) 10 MG tablet Take 10 mg by mouth daily.  04/11/17  Yes [provider]  hydrochlorothiazide (HYDRODIURIL) 25 MG tablet Take 25 mg by mouth daily. 04/11/17  Yes [provider]  acetaminophen (TYLENOL) 500 MG tablet Take 1,000 mg by mouth every 6 (six) hours as needed for headache (pain). Patient not taking: Reported on 12/01/2021    [provider]  Aromatic Inhalants (VICKS VAPOR INHALER IN) Place 1 spray into both nostrils daily as needed (congestion). Patient not taking: Reported on 12/01/2021    [provider]  aspirin EC 81 MG tablet Take 81 mg by mouth daily. Patient not taking: Reported on 12/01/2021    [provider]  celecoxib (CELEBREX) 200 MG capsule Take 200 mg by mouth daily. 10/11/21   [provider]  ketoconazole (NIZORAL) 2 % shampoo Apply 1 application topically 2 (two) times a week.  Patient not taking: Reported on 12/01/2021 12/05/16   [provider]  meclizine (ANTIVERT) 25 MG tablet Take 1 tablet (25 mg total) by mouth 2 (two) times daily. Patient not taking: Reported on 12/01/2021 05/08/17   Roxy Horseman, PA-C    Family History Family History  Problem Relation Age of Onset   Colon cancer Neg Hx    Esophageal cancer Neg Hx    Stomach cancer Neg Hx    Pancreatic cancer Neg Hx    Rectal  cancer Neg Hx     Social History Social History   Tobacco Use   Smoking status: Former    Current packs/day: 0.00    Average packs/day: 1 pack/day for 33.0 years (33.0 ttl pk-yrs)    Types: Cigarettes    Start date: 03/04/1961    Quit date: 03/04/1994    Years since quitting: 28.8   Smokeless tobacco: Never  Substance Use Topics   Alcohol use: No   Drug use: No     Allergies   Ace inhibitors   Review of Systems Review of Systems Per HPI  Physical Exam Triage Vital Signs ED Triage Vitals [01/04/23 1700]  Encounter Vitals Group     BP (!) 154/87     Systolic BP Percentile      Diastolic BP Percentile      Pulse Rate 75     Resp 18     Temp  98.5 F (36.9 C)     Temp Source Oral     SpO2 94 %     Weight      Height      Head Circumference      Peak Flow      Pain Score 0     Pain Loc      Pain Education      Exclude from Growth Chart    No data found.  Updated Vital Signs BP (!) 154/87 (BP Location: Right Arm)   Pulse 75   Temp 98.5 F (36.9 C) (Oral)   Resp 18   SpO2 94%   Visual Acuity Right Eye Distance:   Left Eye Distance:   Bilateral Distance:    Right Eye Near:   Left Eye Near:    Bilateral Near:     Physical Exam Constitutional:      General: He is not in acute distress.    Appearance: Normal appearance. He is not toxic-appearing or diaphoretic.  HENT:     Head: Normocephalic and atraumatic.     Left Ear: No drainage, swelling or tenderness.  No middle ear effusion. There is impacted cerumen. Tympanic membrane is not perforated, erythematous or bulging.     Ears:     Comments: Patient had impacted cerumen to left external canal on original exam.  Ear was irrigated successfully with complete removal of cerumen.  Ear appears normal on second physical exam. Eyes:     Extraocular Movements: Extraocular movements intact.     Conjunctiva/sclera: Conjunctivae normal.  Pulmonary:     Effort: Pulmonary effort is normal.  Neurological:     General: No focal deficit present.     Mental Status: He is alert and oriented to person, place, and time. Mental status is at baseline.  Psychiatric:        Mood and Affect: Mood normal.        Behavior: Behavior normal.        Thought Content: Thought content normal.        Judgment: Judgment normal.      UC Treatments / Results  Labs (all labs ordered are listed, but only abnormal results are displayed) Labs Reviewed - No data to display  EKG   Radiology No results found.  Procedures Procedures (including critical care time)  Medications Ordered in UC Medications  carbamide peroxide (DEBROX) 6.5 % OTIC (EAR) solution 5 drop (5 drops Left EAR  Given 01/04/23 1854)    Initial Impression / Assessment and Plan / UC Course  I have reviewed the  triage vital signs and the nursing notes.  Pertinent labs & imaging results that were available during my care of the patient were reviewed by me and considered in my medical decision making (see chart for details).     Patient had impacted cerumen to left external canal on original exam.  Ear was irrigated successfully with complete removal of cerumen.  Ear appears normal on second physical exam.  Advised strict follow-up precautions.  Patient verbalized understanding and was agreeable with plan. Final Clinical Impressions(s) / UC Diagnoses   Final diagnoses:  Impacted cerumen of left ear   Discharge Instructions   None    ED Prescriptions   None    PDMP not reviewed this encounter.   Gustavus Bryant, [place] 01/04/23 [phone]
